# Patient Record
Sex: Male | Born: 1950
Health system: Southern US, Community
[De-identification: ages and names within clinical notes are randomized; demographics above are authoritative.]

## PROBLEM LIST (undated history)

## (undated) DIAGNOSIS — K5090 Crohn's disease, unspecified, without complications: Secondary | ICD-10-CM

## (undated) DIAGNOSIS — M549 Dorsalgia, unspecified: Secondary | ICD-10-CM

## (undated) DIAGNOSIS — I38 Endocarditis, valve unspecified: Secondary | ICD-10-CM

## (undated) DIAGNOSIS — G8929 Other chronic pain: Secondary | ICD-10-CM

## (undated) DIAGNOSIS — N400 Enlarged prostate without lower urinary tract symptoms: Secondary | ICD-10-CM

## (undated) HISTORY — PX: NO PAST SURGERIES: SHX2092

---

## 2015-05-14 ENCOUNTER — Emergency Department: Payer: BLUE CROSS/BLUE SHIELD

## 2015-05-14 ENCOUNTER — Observation Stay
Admission: EM | Admit: 2015-05-14 | Discharge: 2015-05-15 | Disposition: A | Discharge status: Home / Self Care | Payer: BLUE CROSS/BLUE SHIELD | Attending: Internal Medicine | Admitting: Internal Medicine

## 2015-05-14 ENCOUNTER — Observation Stay: Payer: BLUE CROSS/BLUE SHIELD

## 2015-05-14 DIAGNOSIS — R002 Palpitations: Secondary | ICD-10-CM | POA: Insufficient documentation

## 2015-05-14 DIAGNOSIS — Z79899 Other long term (current) drug therapy: Secondary | ICD-10-CM | POA: Insufficient documentation

## 2015-05-14 DIAGNOSIS — G8929 Other chronic pain: Secondary | ICD-10-CM | POA: Diagnosis not present

## 2015-05-14 DIAGNOSIS — Z825 Family history of asthma and other chronic lower respiratory diseases: Secondary | ICD-10-CM | POA: Insufficient documentation

## 2015-05-14 DIAGNOSIS — K5090 Crohn's disease, unspecified, without complications: Secondary | ICD-10-CM | POA: Insufficient documentation

## 2015-05-14 DIAGNOSIS — M542 Cervicalgia: Secondary | ICD-10-CM | POA: Diagnosis not present

## 2015-05-14 DIAGNOSIS — Z8249 Family history of ischemic heart disease and other diseases of the circulatory system: Secondary | ICD-10-CM | POA: Insufficient documentation

## 2015-05-14 DIAGNOSIS — M549 Dorsalgia, unspecified: Secondary | ICD-10-CM | POA: Insufficient documentation

## 2015-05-14 DIAGNOSIS — R0602 Shortness of breath: Principal | ICD-10-CM | POA: Insufficient documentation

## 2015-05-14 DIAGNOSIS — R05 Cough: Secondary | ICD-10-CM | POA: Diagnosis not present

## 2015-05-14 DIAGNOSIS — R079 Chest pain, unspecified: Secondary | ICD-10-CM | POA: Diagnosis present

## 2015-05-14 HISTORY — DX: Crohn's disease, unspecified, without complications: K50.90

## 2015-05-14 HISTORY — DX: Dorsalgia, unspecified: M54.9

## 2015-05-14 HISTORY — DX: Other chronic pain: G89.29

## 2015-05-14 LAB — COMPREHENSIVE METABOLIC PANEL
ALK PHOS: 52 U/L (ref 38–126)
ALT: 22 U/L (ref 17–63)
AST: 23 U/L (ref 15–41)
Albumin: 4.3 g/dL (ref 3.5–5.0)
Anion gap: 7 (ref 5–15)
BILIRUBIN TOTAL: 1 mg/dL (ref 0.3–1.2)
BUN: 10 mg/dL (ref 6–20)
CALCIUM: 10.4 mg/dL — AB (ref 8.9–10.3)
CHLORIDE: 105 mmol/L (ref 101–111)
CO2: 28 mmol/L (ref 22–32)
CREATININE: 1.23 mg/dL (ref 0.61–1.24)
GFR calc Af Amer: >60 mL/min (ref >60)
Glucose, Bld: 102 mg/dL — ABNORMAL HIGH (ref 65–99)
Potassium: 3.7 mmol/L (ref 3.5–5.1)
Sodium: 140 mmol/L (ref 135–145)
Total Protein: 7.2 g/dL (ref 6.5–8.1)

## 2015-05-14 LAB — CBC WITH DIFFERENTIAL/PLATELET
BASOS ABS: 0.1 10*3/uL (ref 0–0.1)
Basophils Relative: 1 %
Eosinophils Absolute: 0.4 10*3/uL (ref 0–0.7)
Eosinophils Relative: 6 %
HEMATOCRIT: 44.1 % (ref 40.0–52.0)
HEMOGLOBIN: 15.5 g/dL (ref 13.0–18.0)
LYMPHS ABS: 1.8 10*3/uL (ref 1.0–3.6)
LYMPHS PCT: 27 %
MCH: 32.1 pg (ref 26.0–34.0)
MCHC: 35.1 g/dL (ref 32.0–36.0)
MCV: 91.6 fL (ref 80.0–100.0)
Monocytes Absolute: 0.6 10*3/uL (ref 0.2–1.0)
Monocytes Relative: 9 %
NEUTROS ABS: 4 10*3/uL (ref 1.4–6.5)
Neutrophils Relative %: 57 %
PLATELETS: 213 10*3/uL (ref 150–440)
RBC: 4.82 MIL/uL (ref 4.40–5.90)
RDW: 13.2 % (ref 11.5–14.5)
WBC: 6.8 10*3/uL (ref 3.8–10.6)

## 2015-05-14 LAB — TROPONIN I: Troponin I: <0.03 ng/mL (ref <0.031)

## 2015-05-14 MED ORDER — ACETAMINOPHEN 325 MG PO TABS: 650.0000 mg | ORAL_TABLET | Freq: Four times a day (QID) | ORAL | Status: DC | PRN

## 2015-05-14 MED ORDER — IOHEXOL 350 MG/ML SOLN
100.0000 mL | Freq: Once | INTRAVENOUS | Status: AC | PRN
  Administered 2015-05-14: 100 mL via INTRAVENOUS

## 2015-05-14 MED ORDER — FLUTICASONE PROPIONATE 50 MCG/ACT NA SUSP
2.0000 | Freq: Every day | NASAL | Status: DC
  Administered 2015-05-14: 2 via NASAL
  Filled 2015-05-14: qty 16

## 2015-05-14 MED ORDER — ADULT MULTIVITAMIN W/MINERALS CH
1.0000 | ORAL_TABLET | Freq: Every day | ORAL | Status: DC
  Administered 2015-05-14: 1 via ORAL
  Filled 2015-05-14: qty 1

## 2015-05-14 MED ORDER — ENOXAPARIN SODIUM 40 MG/0.4ML ~~LOC~~ SOLN
40.0000 mg | SUBCUTANEOUS | Status: DC
  Administered 2015-05-14: 40 mg via SUBCUTANEOUS
  Filled 2015-05-14: qty 0.4

## 2015-05-14 MED ORDER — SODIUM CHLORIDE 0.9 % IJ SOLN
3.0000 mL | Freq: Two times a day (BID) | INTRAMUSCULAR | Status: DC
  Administered 2015-05-14: 3 mL via INTRAVENOUS

## 2015-05-14 MED ORDER — ASPIRIN 81 MG PO CHEW
81.0000 mg | CHEWABLE_TABLET | Freq: Every day | ORAL | Status: DC
  Administered 2015-05-14: 81 mg via ORAL
  Filled 2015-05-14: qty 1

## 2015-05-14 MED ORDER — PSYLLIUM 95 % PO PACK
1.0000 | PACK | Freq: Every day | ORAL | Status: DC
  Administered 2015-05-14: 1 via ORAL
  Filled 2015-05-14 (×2): qty 1

## 2015-05-14 MED ORDER — ACETAMINOPHEN 650 MG RE SUPP: 650.0000 mg | Freq: Four times a day (QID) | RECTAL | Status: DC | PRN

## 2015-05-14 MED ORDER — SIMETHICONE 80 MG PO CHEW
80.0000 mg | CHEWABLE_TABLET | Freq: Two times a day (BID) | ORAL | Status: DC
  Administered 2015-05-14: 160 mg via ORAL
  Filled 2015-05-14: qty 2

## 2015-05-14 NOTE — ED Provider Notes (Signed)
[facility] Emergency Department Provider Note  ____________________________________________  Time seen: Approximately 12:55 PM  I have reviewed the triage vital signs and the nursing notes.   HISTORY  Chief Complaint Shortness of Breath    HPI Douglas Humphrey is a 65 y.o. male with history of Crohn's disease, no other known medical issues (but he he has also not been evaluated by primary care doctor in nearly 15 years) who presents for evaluation of worsening shortness of breath with exertion as well as several days of intermittent left chest pressure radiating into the neck and the left arm, also worse with exertion, no other modifying factors, currently resolved after he received 325 mg aspirin at urgent care. He reports strong family history of coronary artery disease. No personal or family history of PE or DVT. No hemoptysis, no hormone use, no recent surgery, no prolonged period of immobilization. He has had a dry nonproductive cough but no fevers.   No past medical history on file.  There are no active problems to display for this patient.   No past surgical history on file.  Current Outpatient Rx  Name  Route  Sig  Dispense  Refill  . glucosamine-chondroitin 500-400 MG tablet   Oral   Take 1 tablet by mouth at bedtime.         . Guaifenesin 1200 MG TB12   Oral   Take 1,200 mg by mouth at bedtime as needed (for cough).         . Multiple Vitamin (MULTIVITAMIN WITH MINERALS) TABS tablet   Oral   Take 1 tablet by mouth at bedtime.         . polycarbophil (FIBERCON) 625 MG tablet   Oral   Take 625 mg by mouth at bedtime.         . pseudoephedrine (SUDAFED) 30 MG tablet   Oral   Take 30 mg by mouth daily as needed for congestion.         . simethicone (MYLICON) 80 MG chewable tablet   Oral   Chew 80-160 mg by mouth 2 (two) times daily. Pt takes one tablet in the morning and two at night.           Allergies Review of patient's  allergies indicates no known allergies.  No family history on file.  Social History Social History  Substance Use Topics  . Smoking status: Never Smoker   . Smokeless tobacco: Not on file  . Alcohol Use: No    Review of Systems Constitutional: No fever/chills Eyes: No visual changes. ENT: No sore throat. Cardiovascular: + chest pain. Respiratory: +shortness of breath. Gastrointestinal: No abdominal pain.  No nausea, no vomiting.  No diarrhea.  No constipation. Genitourinary: Negative for dysuria. Musculoskeletal: Negative for back pain. Skin: Negative for rash. Neurological: Negative for headaches, focal weakness or numbness.  10-point ROS otherwise negative.  ____________________________________________   PHYSICAL EXAM: Filed Vitals:   05/14/15 1254 05/14/15 1300 05/14/15 1430 05/14/15 1500  BP: 168/84 138/97 144/91 153/92  Pulse: 76 73 71 77  Temp: 98.7 F (37.1 C)     TempSrc: Oral     Resp: Height:  (1.753 m)     Weight: 208 lb (94.348 kg)     SpO2: 96% 97% 95% 94%     Constitutional: Alert and oriented. Well appearing and in no acute distress. Eyes: Conjunctivae are normal. PERRL. EOMI. Head: Atraumatic. Nose: No congestion/rhinnorhea. Mouth/Throat: Mucous membranes are moist.  Oropharynx non-erythematous. Neck: No stridor.   Cardiovascular: Normal rate, regular rhythm. Grossly normal heart sounds.  Good peripheral circulation. Respiratory: Normal respiratory effort.  No retractions. Lungs CTAB. Gastrointestinal: Soft and nontender. No distention.  No CVA tenderness. Genitourinary: deferred Musculoskeletal: No lower extremity tenderness nor edema.  No joint effusions. No calf asymmetry. Neurologic:  Normal speech and language. No gross focal neurologic deficits are appreciated.  Skin:  Skin is warm, dry and intact. No rash noted. Psychiatric: Mood and affect are normal. Speech and behavior are  normal.  ____________________________________________   LABS (all labs ordered are listed, but only abnormal results are displayed)  Labs Reviewed  COMPREHENSIVE METABOLIC PANEL - Abnormal; Notable for the following:    Glucose, Bld 102 (*)    Calcium 10.4 (*)    All other components within normal limits  CBC WITH DIFFERENTIAL/PLATELET  TROPONIN I   ____________________________________________  EKG  ED ECG REPORT I, Gayla Doss, the attending physician, personally viewed and interpreted this ECG.   Date: 05/14/2015  EKG Time: 12:48  Rate: 80  Rhythm: normal sinus rhythm  Axis: normal  Intervals:none  ST&T Change: No acute ST elevation.  Less than 1 mm ST depression V3 and V4. T-wave inversions in lead 3, V3, V4. T-wave flattening in V5, V6.  ____________________________________________  RADIOLOGY  CXR IMPRESSION: 1. Probable COPD and chronic bronchitic changes centrally. 2. No evidence of acute cardiopulmonary abnormality. No evidence of pneumonia. ____________________________________________   PROCEDURES  Procedure(s) performed: None  Critical Care performed: No  ____________________________________________   INITIAL IMPRESSION / ASSESSMENT AND PLAN / ED COURSE  Pertinent labs & imaging results that were available during my care of the patient were reviewed by me and considered in my medical decision making (see chart for details).  Kenley Troop is a 65 y.o. male with history of Crohn's disease, no other known medical issues (but he he has also not been evaluated by primary care doctor in nearly 15 years) who presents for evaluation of worsening shortness of breath with exertion as well as chest pressure with exertion. On exam, he is generally well-appearing and in no acute distress. Vital signs are stable, he is afebrile. EKG shows inverted T waves in inferior and anterior leads with minimal ST depression in anterior leads but no prior EKGs available for  comparison. My concern is for ACS/unstable angina given his exertional complaints, strong family history of coronary artery disease. Additionally, his other risk factors are essentially unknown since he has not seen a doctor in more than a decade. His pain is not ripping or tearing in nature, does not radiate to the back or down towards the feet, I doubt acute aortic dissection. Pain is not pleuritic and I doubt pulmonary embolism. Plan for screening cardiac labs, chest x-ray, anticipate admission.  ----------------------------------------- 3:39 PM on 05/14/2015 ----------------------------------------- Labs reviewed. Troponin is negative. CBC and CMP are general unremarkable. Chest x-ray shows evidence of probable COPD, no pneumonia. Currently, the patient is chest pain-free. Case discussed with hospitalist for admission at this time.  ____________________________________________   FINAL CLINICAL IMPRESSION(S) / ED DIAGNOSES  Final diagnoses:  Chest pain, unspecified chest pain type  SOB (shortness of breath) on exertion      Gayla Doss, MD 05/14/15 1541

## 2015-05-14 NOTE — H&P (Signed)
[facility] - [place] at [facility]   PATIENT NAME: Douglas Humphrey    MR#:  [id number]  DATE OF BIRTH:  1951/03/14  DATE OF ADMISSION:  05/14/2015  PRIMARY CARE PHYSICIAN: Zara Chess at [place] [facility]  REQUESTING/REFERRING PHYSICIAN: Chari Manning  CHIEF COMPLAINT:   Chief Complaint  Patient presents with  . Shortness of Breath    HISTORY OF PRESENT ILLNESS:  Tramayne Sebesta  is a 65 y.o. male presents with shortness of breath and chest pain. This has been going on for the last couple weeks or so. Can happen when he is walking. He describes the chest pain as 3 out of 10 intensity in the left chest radiating down his arms associated with palpitations and shortness of breath. He also has a pain that goes up the back of his neck and it comes and goes. Nothing makes it better or worse. Is also been having some shortness of breath with slight cough.  PAST MEDICAL HISTORY:   Past Medical History  Diagnosis Date  . Crohn's disease (HCC)   . Chronic back pain     PAST SURGICAL HISTORY:   Past Surgical History  Procedure Laterality Date  . No past surgeries      SOCIAL HISTORY:   Social History  Substance Use Topics  . Smoking status: Never Smoker   . Smokeless tobacco: Not on file  . Alcohol Use: No    FAMILY HISTORY:   Family History  Problem Relation Age of Onset  . CAD Mother   . COPD Mother   . CAD Father     DRUG ALLERGIES:  No Known Allergies  REVIEW OF SYSTEMS:  CONSTITUTIONAL: No fever, positive for fatigue.  EYES: Occasional blurry vision. Occasional left thigh pain and itching EARS, NOSE, AND THROAT: No tinnitus or ear pain. No sore throat. Decreased hearing. Positive for runny nose RESPIRATORY: Occasional cough, positive for shortness of breath, no wheezing or hemoptysis.  CARDIOVASCULAR: Positive for chest pain, no orthopnea, edema.  GASTROINTESTINAL: No nausea, vomiting. Positive for diarrhea and abdominal pain shooting in  the abdomen. No blood in bowel movements. GENITOURINARY: No dysuria, hematuria.  ENDOCRINE: No polyuria, nocturia,  HEMATOLOGY: No anemia, easy bruising or bleeding SKIN: No rash or lesion. MUSCULOSKELETAL: Positive for joint pain   NEUROLOGIC: No tingling, numbness, weakness.  PSYCHIATRY: No anxiety or depression.   MEDICATIONS AT HOME:   Prior to Admission medications   Medication Sig Start Date End Date Taking? Authorizing Provider  glucosamine-chondroitin 500-400 MG tablet Take 1 tablet by mouth at bedtime.   Yes Historical Provider, MD  Guaifenesin 1200 MG TB12 Take 1,200 mg by mouth at bedtime as needed (for cough).   Yes Historical Provider, MD  Multiple Vitamin (MULTIVITAMIN WITH MINERALS) TABS tablet Take 1 tablet by mouth at bedtime.   Yes Historical Provider, MD  polycarbophil (FIBERCON) 625 MG tablet Take 625 mg by mouth at bedtime.   Yes Historical Provider, MD  pseudoephedrine (SUDAFED) 30 MG tablet Take 30 mg by mouth daily as needed for congestion.   Yes Historical Provider, MD  simethicone (MYLICON) 80 MG chewable tablet Chew 80-160 mg by mouth 2 (two) times daily. Pt takes one tablet in the morning and two at night.   Yes Historical Provider, MD      VITAL SIGNS:  Blood pressure 153/92, pulse 77, temperature 98.7 F (37.1 C), temperature source Oral, resp. rate 11, height 5\' 9"  (1.753 m), weight 94.348 kg (208 lb), SpO2 94 %.  PHYSICAL EXAMINATION:  GENERAL:  65 y.o.-year-old patient lying in the bed with no acute distress. The patient does have to stop talking at times to take a deep breath. EYES: Pupils equal, round, reactive to light and accommodation. No scleral icterus. Extraocular muscles intact.  HEENT: Head atraumatic, normocephalic. Oropharynx and nasopharynx clear.  NECK:  Supple, no jugular venous distention. No thyroid enlargement, no tenderness.  LUNGS: Normal breath sounds bilaterally, coughs with deep breath. no wheezing, rales,rhonchi or crepitation.  No use of accessory muscles of respiration.  CARDIOVASCULAR: S1, S2 normal. No murmurs, rubs, or gallops.  ABDOMEN: Soft, nontender, nondistended. Bowel sounds present. No organomegaly or mass.  EXTREMITIES: No pedal edema, cyanosis, or clubbing.  NEUROLOGIC: Cranial nerves II through XII are intact. Muscle strength 5/5 in all extremities. Sensation intact. Gait not checked.  PSYCHIATRIC: The patient is alert and oriented x 3.  SKIN: No rash, lesion, or ulcer.   LABORATORY PANEL:   CBC  Recent Labs Lab 05/14/15 1354  WBC 6.8  HGB 15.5  HCT 44.1  PLT 213   ------------------------------------------------------------------------------------------------------------------  Chemistries   Recent Labs Lab 05/14/15 1354  NA 140  K 3.7  CL 105  CO2 28  GLUCOSE 102*  BUN 10  CREATININE 1.23  CALCIUM 10.4*  AST 23  ALT 22  ALKPHOS 52  BILITOT 1.0   ------------------------------------------------------------------------------------------------------------------  Cardiac Enzymes  Recent Labs Lab 05/14/15 1354  TROPONINI <0.03   ------------------------------------------------------------------------------------------------------------------  RADIOLOGY:  Dg Chest 2 View  05/14/2015  CLINICAL DATA:  Dry cough and chest tightness for a couple of weeks. EXAM: CHEST  2 VIEW COMPARISON:  None. FINDINGS: Heart size is normal. Overall cardiomediastinal silhouette is normal in size and configuration. Lungs are at least mildly hyperexpanded suggesting COPD. Suspect associated chronic bronchitic changes centrally. No evidence of pneumonia. No pleural effusion. No pneumothorax seen. Mild degenerative change noted throughout the thoracic spine. No acute - appearing osseous abnormality. IMPRESSION: 1. Probable COPD and chronic bronchitic changes centrally. 2. No evidence of acute cardiopulmonary abnormality. No evidence of pneumonia. Electronically Signed   By: Bary Cobi M.D.   On:  05/14/2015 13:33    EKG:   Sinus rhythm 80 bpm  IMPRESSION AND PLAN:   1. Chest pain and shortness of breath will admitted as an observation. I will get a CT scan of the chest to rule out pulmonary embolism. If that is negative I may be able to get a stress test in the morning. I will get serial cardiac enzymes and monitor. Given aspirin. 2. Crohn's disease - not on any medications 3. Chronic back and neck pain- not on any medications for this. Check a rheumatoid factor. 4. Hypercalcemia- likely with a little dehydration we'll give IV fluid hydration repeat a calcium tomorrow. Send off PTH.  All the records are reviewed and case discussed with ED provider. Management plans discussed with the patient, family and they are in agreement.  CODE STATUS: Full code  TOTAL TIME TAKING CARE OF THIS PATIENT: 50 minutes.    Alford Highland M.D on 05/14/2015 at 4:22 PM  Between 7am to 6pm - Pager - [phone]  After 6pm call admission pager [phone]  [facility]  Office  [phone]  CC: Primary care physician; Zara Chess at [facility] medical

## 2015-05-14 NOTE — Progress Notes (Signed)
Dr. Renae Gloss asked why patient with chest pain with stress test to be completed tomorrow being sent to med-surg floor instead of 2A.  Dr. Renae Gloss feels that patient does not need to be placed on 2A.

## 2015-05-14 NOTE — ED Notes (Signed)
Pt arrives here via [facility] from [facility]   Pt with increased shortness of breath, pressure in his chest and neck pain

## 2015-05-15 ENCOUNTER — Observation Stay: Payer: BLUE CROSS/BLUE SHIELD

## 2015-05-15 ENCOUNTER — Other Ambulatory Visit: Payer: BLUE CROSS/BLUE SHIELD

## 2015-05-15 ENCOUNTER — Observation Stay: Admit: 2015-05-15 | Payer: BLUE CROSS/BLUE SHIELD

## 2015-05-15 ENCOUNTER — Observation Stay
Admit: 2015-05-15 | Discharge: 2015-05-15 | Disposition: A | Discharge status: Home / Self Care | Payer: BLUE CROSS/BLUE SHIELD | Attending: Internal Medicine | Admitting: Internal Medicine

## 2015-05-15 LAB — LIPID PANEL
Cholesterol: 165 mg/dL (ref 0–200)
HDL: 35 mg/dL — ABNORMAL LOW (ref >40)
LDL Cholesterol: 100 mg/dL — ABNORMAL HIGH (ref 0–99)
Total CHOL/HDL Ratio: 4.7 RATIO
Triglycerides: 152 mg/dL — ABNORMAL HIGH (ref <150)
VLDL: 30 mg/dL (ref 0–40)

## 2015-05-15 LAB — CBC
HCT: 43.4 % (ref 40.0–52.0)
Hemoglobin: 14.9 g/dL (ref 13.0–18.0)
MCH: 31.4 pg (ref 26.0–34.0)
MCHC: 34.3 g/dL (ref 32.0–36.0)
MCV: 91.5 fL (ref 80.0–100.0)
PLATELETS: 206 10*3/uL (ref 150–440)
RBC: 4.74 MIL/uL (ref 4.40–5.90)
RDW: 12.9 % (ref 11.5–14.5)
WBC: 7.5 10*3/uL (ref 3.8–10.6)

## 2015-05-15 LAB — BASIC METABOLIC PANEL
Anion gap: 8 (ref 5–15)
BUN: 11 mg/dL (ref 6–20)
CALCIUM: 9.6 mg/dL (ref 8.9–10.3)
CHLORIDE: 105 mmol/L (ref 101–111)
CO2: 27 mmol/L (ref 22–32)
CREATININE: 1.19 mg/dL (ref 0.61–1.24)
GFR calc Af Amer: >60 mL/min (ref >60)
GFR calc non Af Amer: >60 mL/min (ref >60)
Glucose, Bld: 104 mg/dL — ABNORMAL HIGH (ref 65–99)
Potassium: 3.7 mmol/L (ref 3.5–5.1)
Sodium: 140 mmol/L (ref 135–145)

## 2015-05-15 LAB — NM MYOCAR MULTI W/SPECT W/WALL MOTION / EF
CHL CUP NUCLEAR SSS: 2
LVDIAVOL: 67 mL
LVSYSVOL: 15 mL
NUC STRESS TID: 0.9
SDS: 1
SRS: 3

## 2015-05-15 MED ORDER — ASPIRIN 81 MG PO CHEW: 81.0000 mg | CHEWABLE_TABLET | Freq: Every day | ORAL | Status: DC

## 2015-05-15 MED ORDER — REGADENOSON 0.4 MG/5ML IV SOLN
0.4000 mg | Freq: Once | INTRAVENOUS | Status: AC
  Administered 2015-05-15: 0.4 mg via INTRAVENOUS

## 2015-05-15 MED ORDER — TECHNETIUM TC 99M SESTAMIBI - CARDIOLITE
12.2500 | Freq: Once | INTRAVENOUS | Status: AC | PRN
  Administered 2015-05-15: 12.25 via INTRAVENOUS

## 2015-05-15 MED ORDER — TECHNETIUM TC 99M SESTAMIBI - CARDIOLITE
32.1550 | Freq: Once | INTRAVENOUS | Status: AC | PRN
  Administered 2015-05-15: 13:00:00 32.155 via INTRAVENOUS

## 2015-05-15 NOTE — Progress Notes (Signed)
Pt alert and oriented. Called ECHO to notify them that the pt is pending discharge and needs ECHO prior to discharge. ECHO aware. Discharge paper work given to pt.  Nurse report for next shift to be given to oncoming nurse.   IV site still needs to be removed prior to discharge, if not discharged before 7pm

## 2015-05-15 NOTE — Progress Notes (Signed)
SUBJECTIVE: No further chest pains   Filed Vitals:   05/14/15 1839 05/14/15 1958 05/15/15 0045 05/15/15 0516  BP: 158/79 153/86 117/76 108/66  Pulse: 71 80 71 80  Temp: 98.2 F (36.8 C) 98.2 F (36.8 C) 98 F (36.7 C) 97.8 F (36.6 C)  TempSrc: Oral Oral Oral Oral  Resp: 16 18 18 16   Height:      Weight:      SpO2: 97% 96% 97% 94%    Intake/Output Summary (Last 24 hours) at 05/15/15 1301 Last data filed at 05/15/15 0800  Gross per 24 hour  Intake    240 ml  Output   1300 ml  Net  -1060 ml    LABS: Basic Metabolic Panel:  Recent Labs  77/11/65 1354 05/15/15 0527  NA 140 140  K 3.7 3.7  CL 105 105  CO2 28 27  GLUCOSE 102* 104*  BUN 10 11  CREATININE 1.23 1.19  CALCIUM 10.4* 9.6   Liver Function Tests:  Recent Labs  05/14/15 1354  AST 23  ALT 22  ALKPHOS 52  BILITOT 1.0  PROT 7.2  ALBUMIN 4.3   No results for input(s): LIPASE, AMYLASE in the last 72 hours. CBC:  Recent Labs  05/14/15 1354 05/15/15 0527  WBC 6.8 7.5  NEUTROABS 4.0  --   HGB 15.5 14.9  HCT 44.1 43.4  MCV 91.6 91.5  PLT 213 206   Cardiac Enzymes:  Recent Labs  05/14/15 1354 05/14/15 1813 05/14/15 2303  TROPONINI <0.03 <0.03 <0.03   BNP: Invalid input(s): POCBNP D-Dimer: No results for input(s): DDIMER in the last 72 hours. Hemoglobin A1C: No results for input(s): HGBA1C in the last 72 hours. Fasting Lipid Panel:  Recent Labs  05/15/15 0527  CHOL 165  HDL 35*  LDLCALC 100*  TRIG 152*  CHOLHDL 4.7   Thyroid Function Tests: No results for input(s): TSH, T4TOTAL, T3FREE, THYROIDAB in the last 72 hours.  Invalid input(s): FREET3 Anemia Panel: No results for input(s): VITAMINB12, FOLATE, FERRITIN, TIBC, IRON, RETICCTPCT in the last 72 hours.   PHYSICAL EXAM General: Well developed, well nourished, in no acute distress HEENT:  Normocephalic and atramatic Neck:  No JVD.  Lungs: Clear bilaterally to auscultation and percussion. Heart: HRRR . Normal S1 and S2  without gallops or murmurs.  Abdomen: Bowel sounds are positive, abdomen soft and non-tender  Msk:  Back normal, normal gait. Normal strength and tone for age. Extremities: No clubbing, cyanosis or edema.   Neuro: Alert and oriented X 3. Psych:  Good affect, responds appropriately  TELEMETRY: NSR  ASSESSMENT AND PLAN:Chest pains atypical, MI ruled out. Stress test unremarkable and can go home with f/u office tomorrow at 2 pm.  Active Problems:   Shortness of breath    Melika Reder A, MD, [facility] 05/15/2015 1:01 PM

## 2015-05-16 NOTE — Discharge Summary (Signed)
Douglas Humphrey, 65 y.o., DOB Jan 05, 1951, MRN [id number]. Admission date: 05/14/2015 Discharge Date 05/16/2015 Primary MD No primary care provider on file. Admitting Physician Alford Highland, MD  Admission Diagnosis  Shortness of breath [R06.02] SOB (shortness of breath) on exertion [R06.02] Chest pain [R07.9] Chest pain, unspecified chest pain type [R07.9]  Discharge Diagnosis   Active Problems:   Shortness of breath unclear etiology Chest pain noncardiac status post stress test  Crohn's disease Chronic back pain         Hospital Course  Douglas Humphrey is a 65 y.o. male presents with shortness of breath and chest pain. Patient presented with the symptoms placed under observation and was seen by cardiology and underwent stress test which was negative also had a CT of the chest per PE which was negative for pulmonary embolism. Also had echocardiogram of the heart which was negative. At this point patient will need to follow-up with pulmonary and get PFTs test if still short of breath.           Consults  cardiology  Significant Tests:  See full reports for all details      Dg Chest 2 View  05/14/2015  CLINICAL DATA:  Dry cough and chest tightness for a couple of weeks. EXAM: CHEST  2 VIEW COMPARISON:  None. FINDINGS: Heart size is normal. Overall cardiomediastinal silhouette is normal in size and configuration. Lungs are at least mildly hyperexpanded suggesting COPD. Suspect associated chronic bronchitic changes centrally. No evidence of pneumonia. No pleural effusion. No pneumothorax seen. Mild degenerative change noted throughout the thoracic spine. No acute - appearing osseous abnormality. IMPRESSION: 1. Probable COPD and chronic bronchitic changes centrally. 2. No evidence of acute cardiopulmonary abnormality. No evidence of pneumonia. Electronically Signed   By: Bary Barack M.D.   On: 05/14/2015 13:33   Ct Angio Chest Pe W/cm &/or Wo Cm  05/14/2015  CLINICAL DATA:   Shortness of breath and chest pain. EXAM: CT ANGIOGRAPHY CHEST WITH CONTRAST TECHNIQUE: Multidetector CT imaging of the chest was performed using the standard protocol during bolus administration of intravenous contrast. Multiplanar CT image reconstructions and MIPs were obtained to evaluate the vascular anatomy. CONTRAST:  OMNIPAQUE IOHEXOL 350 MG/ML SOLN COMPARISON:  Chest x-ray earlier today. FINDINGS: The pulmonary arteries are well opacified. There is no evidence of pulmonary embolism. The thoracic aorta is normal in caliber. Scattered areas of parenchymal scarring and atelectasis noted without focal airspace consolidation. No edema, pneumothorax, pulmonary nodules or pleural effusions. The heart size is normal. No pericardial fluid identified. Small amount of calcified plaque is identified in the distribution of the LAD. No enlarged lymph nodes are seen. Visualized upper abdominal structures are unremarkable. No bony abnormalities are identified. Review of the MIP images confirms the above findings. IMPRESSION: 1. No evidence of pulmonary embolism. 2. Small amount of calcified plaque in the distribution of the LAD. Electronically Signed   By: Irish Lack M.D.   On: 05/14/2015 17:28   Nm Myocar Multi W/spect W/wall Motion / Ef  05/15/2015   The study is normal.  This is a low risk study.  The left ventricular ejection fraction is hyperdynamic (>65%).  Nuclear stress EF: 78%.        Today   Subjective:   Douglas Humphrey  ills better denies any chest pain or shortness of breath  Objective:   Blood pressure 108/66, pulse 80, temperature 97.8 F (36.6 C), temperature source Oral, resp. rate 16, height  (1.753 m), weight 94.348 kg (  208 lb), SpO2 94 %.  .  Intake/Output Summary (Last 24 hours) at 05/16/15 1640 Last data filed at 05/15/15 1800  Gross per 24 hour  Intake    240 ml  Output    300 ml  Net    -60 ml    Exam VITAL SIGNS: Blood pressure 108/66, pulse 80,  temperature 97.8 F (36.6 C), temperature source Oral, resp. rate 16, height 5\' 9"  (1.753 m), weight 94.348 kg (208 lb), SpO2 94 %.  GENERAL:  65 y.o.-year-old patient lying in the bed with no acute distress.  EYES: Pupils equal, round, reactive to light and accommodation. No scleral icterus. Extraocular muscles intact.  HEENT: Head atraumatic, normocephalic. Oropharynx and nasopharynx clear.  NECK:  Supple, no jugular venous distention. No thyroid enlargement, no tenderness.  LUNGS: Normal breath sounds bilaterally, no wheezing, rales,rhonchi or crepitation. No use of accessory muscles of respiration.  CARDIOVASCULAR: S1, S2 normal. No murmurs, rubs, or gallops.  ABDOMEN: Soft, nontender, nondistended. Bowel sounds present. No organomegaly or mass.  EXTREMITIES: No pedal edema, cyanosis, or clubbing.  NEUROLOGIC: Cranial nerves II through XII are intact. Muscle strength 5/5 in all extremities. Sensation intact. Gait not checked.  PSYCHIATRIC: The patient is alert and oriented x 3.  SKIN: No obvious rash, lesion, or ulcer.   Data Review     CBC w Diff: Lab Results  Component Value Date   WBC 7.5 05/15/2015   HGB 14.9 05/15/2015   HCT 43.4 05/15/2015   PLT 206 05/15/2015   LYMPHOPCT 27 05/14/2015   MONOPCT 9 05/14/2015   EOSPCT 6 05/14/2015   BASOPCT 1 05/14/2015   CMP: Lab Results  Component Value Date   NA 140 05/15/2015   K 3.7 05/15/2015   CL 105 05/15/2015   CO2 27 05/15/2015   BUN 11 05/15/2015   CREATININE 1.19 05/15/2015   PROT 7.2 05/14/2015   ALBUMIN 4.3 05/14/2015   BILITOT 1.0 05/14/2015   ALKPHOS 52 05/14/2015   AST 23 05/14/2015   ALT 22 05/14/2015  .  Micro Results No results found for this or any previous visit (from the past 240 hour(s)).   Code Status History    Date Active Date Inactive Code Status Order ID Comments User Context   05/14/2015  4:17 PM 05/15/2015 11:45 PM Full Code [id number]  Alford Highland, MD ED          Follow-up  Information    Follow up with Erin Fulling, MD. Schedule an appointment as soon as possible for a visit in 2 weeks.   Specialty:  Pulmonary Disease   Why:  shortness of breath   Contact information:   [address] [place] [address] [phone]       Follow up with pcp In 7 days.      Discharge Medications     Medication List    TAKE these medications        aspirin 81 MG chewable tablet  Chew 1 tablet (81 mg total) by mouth daily.     glucosamine-chondroitin 500-400 MG tablet  Take 1 tablet by mouth at bedtime.     Guaifenesin 1200 MG Tb12  Take 1,200 mg by mouth at bedtime as needed (for cough).     multivitamin with minerals Tabs tablet  Take 1 tablet by mouth at bedtime.     polycarbophil 625 MG tablet  Commonly known as:  FIBERCON  Take 625 mg by mouth at bedtime.     pseudoephedrine  30 MG tablet  Commonly known as:  SUDAFED  Take 30 mg by mouth daily as needed for congestion.     simethicone 80 MG chewable tablet  Commonly known as:  MYLICON  Chew 80-160 mg by mouth 2 (two) times daily. Pt takes one tablet in the morning and two at night.           Total Time in preparing paper work, data evaluation and todays exam - 35 minutes  Auburn Bilberry M.D on 05/16/2015 at 4:40 PM  [facility]   Office  [phone]

## 2015-07-28 ENCOUNTER — Ambulatory Visit
Admission: RE | Admit: 2015-07-28 | Payer: BLUE CROSS/BLUE SHIELD | Source: Ambulatory Visit | Admitting: Gastroenterology

## 2015-07-28 ENCOUNTER — Encounter: Admission: RE | Payer: Self-pay | Source: Ambulatory Visit

## 2015-07-28 SURGERY — COLONOSCOPY WITH PROPOFOL
Anesthesia: General

## 2016-02-19 ENCOUNTER — Encounter: Payer: Self-pay | Admitting: Internal Medicine

## 2017-03-24 DIAGNOSIS — I2510 Atherosclerotic heart disease of native coronary artery without angina pectoris: Secondary | ICD-10-CM | POA: Diagnosis not present

## 2017-03-24 DIAGNOSIS — I340 Nonrheumatic mitral (valve) insufficiency: Secondary | ICD-10-CM | POA: Diagnosis not present

## 2017-03-24 DIAGNOSIS — K219 Gastro-esophageal reflux disease without esophagitis: Secondary | ICD-10-CM | POA: Diagnosis not present

## 2017-03-24 DIAGNOSIS — I351 Nonrheumatic aortic (valve) insufficiency: Secondary | ICD-10-CM | POA: Diagnosis not present

## 2017-03-28 DIAGNOSIS — I2510 Atherosclerotic heart disease of native coronary artery without angina pectoris: Secondary | ICD-10-CM | POA: Diagnosis not present

## 2017-04-01 DIAGNOSIS — M4126 Other idiopathic scoliosis, lumbar region: Secondary | ICD-10-CM | POA: Diagnosis not present

## 2017-04-01 DIAGNOSIS — K5090 Crohn's disease, unspecified, without complications: Secondary | ICD-10-CM | POA: Diagnosis not present

## 2017-04-01 DIAGNOSIS — M1990 Unspecified osteoarthritis, unspecified site: Secondary | ICD-10-CM | POA: Diagnosis not present

## 2017-04-01 DIAGNOSIS — M545 Low back pain: Secondary | ICD-10-CM | POA: Diagnosis not present

## 2017-08-22 DIAGNOSIS — R002 Palpitations: Secondary | ICD-10-CM | POA: Diagnosis not present

## 2017-08-22 DIAGNOSIS — I340 Nonrheumatic mitral (valve) insufficiency: Secondary | ICD-10-CM | POA: Diagnosis not present

## 2017-08-22 DIAGNOSIS — K219 Gastro-esophageal reflux disease without esophagitis: Secondary | ICD-10-CM | POA: Diagnosis not present

## 2017-08-22 DIAGNOSIS — I351 Nonrheumatic aortic (valve) insufficiency: Secondary | ICD-10-CM | POA: Diagnosis not present

## 2019-12-12 ENCOUNTER — Other Ambulatory Visit: Payer: Self-pay

## 2019-12-12 ENCOUNTER — Emergency Department: Payer: Medicare Other

## 2019-12-12 ENCOUNTER — Emergency Department
Admission: EM | Admit: 2019-12-12 | Discharge: 2019-12-12 | Disposition: A | Discharge status: Home / Self Care | Payer: Medicare Other | Attending: Emergency Medicine | Admitting: Emergency Medicine

## 2019-12-12 ENCOUNTER — Encounter: Payer: Self-pay | Admitting: Emergency Medicine

## 2019-12-12 DIAGNOSIS — Z5321 Procedure and treatment not carried out due to patient leaving prior to being seen by health care provider: Secondary | ICD-10-CM | POA: Insufficient documentation

## 2019-12-12 DIAGNOSIS — R509 Fever, unspecified: Secondary | ICD-10-CM | POA: Diagnosis not present

## 2019-12-12 DIAGNOSIS — R05 Cough: Secondary | ICD-10-CM | POA: Diagnosis not present

## 2019-12-12 HISTORY — DX: Endocarditis, valve unspecified: I38

## 2019-12-12 LAB — COMPREHENSIVE METABOLIC PANEL
ALT: 22 U/L (ref 0–44)
AST: 27 U/L (ref 15–41)
Albumin: 4.6 g/dL (ref 3.5–5.0)
Alkaline Phosphatase: 45 U/L (ref 38–126)
Anion gap: 14 (ref 5–15)
BUN: 13 mg/dL (ref 8–23)
CO2: 21 mmol/L — ABNORMAL LOW (ref 22–32)
Calcium: 9.6 mg/dL (ref 8.9–10.3)
Chloride: 103 mmol/L (ref 98–111)
Creatinine, Ser: 1.13 mg/dL (ref 0.61–1.24)
GFR calc Af Amer: >60 mL/min (ref >60)
GFR calc non Af Amer: >60 mL/min (ref >60)
Glucose, Bld: 114 mg/dL — ABNORMAL HIGH (ref 70–99)
Potassium: 4.1 mmol/L (ref 3.5–5.1)
Sodium: 138 mmol/L (ref 135–145)
Total Bilirubin: 0.9 mg/dL (ref 0.3–1.2)
Total Protein: 8 g/dL (ref 6.5–8.1)

## 2019-12-12 LAB — CBC WITH DIFFERENTIAL/PLATELET
Abs Immature Granulocytes: 0.02 10*3/uL (ref 0.00–0.07)
Basophils Absolute: 0 10*3/uL (ref 0.0–0.1)
Basophils Relative: 0 %
Eosinophils Absolute: 0 10*3/uL (ref 0.0–0.5)
Eosinophils Relative: 1 %
HCT: 42 % (ref 39.0–52.0)
Hemoglobin: 15.3 g/dL (ref 13.0–17.0)
Immature Granulocytes: 0 %
Lymphocytes Relative: 22 %
Lymphs Abs: 1.4 10*3/uL (ref 0.7–4.0)
MCH: 32.6 pg (ref 26.0–34.0)
MCHC: 36.4 g/dL — ABNORMAL HIGH (ref 30.0–36.0)
MCV: 89.4 fL (ref 80.0–100.0)
Monocytes Absolute: 0.3 10*3/uL (ref 0.1–1.0)
Monocytes Relative: 5 %
Neutro Abs: 4.5 10*3/uL (ref 1.7–7.7)
Neutrophils Relative %: 72 %
Platelets: 144 10*3/uL — ABNORMAL LOW (ref 150–400)
RBC: 4.7 MIL/uL (ref 4.22–5.81)
RDW: 12.2 % (ref 11.5–15.5)
WBC: 6.2 10*3/uL (ref 4.0–10.5)
nRBC: 0 % (ref 0.0–0.2)

## 2019-12-12 NOTE — ED Triage Notes (Signed)
Pt presents to ED with fever today and a productive barking cough for the past week+. Has been taking mucinex with some relief initially but now is feeling worse. Slight increased work of breathing noted with ambulation. Cough seems to worsen when talking.

## 2020-01-28 ENCOUNTER — Other Ambulatory Visit: Payer: Self-pay | Admitting: Nurse Practitioner

## 2020-01-28 DIAGNOSIS — N492 Inflammatory disorders of scrotum: Secondary | ICD-10-CM

## 2020-01-30 ENCOUNTER — Ambulatory Visit
Admission: RE | Admit: 2020-01-30 | Discharge: 2020-01-30 | Disposition: A | Discharge status: Home / Self Care | Payer: Medicare Other | Source: Ambulatory Visit | Attending: Nurse Practitioner | Admitting: Nurse Practitioner

## 2020-01-30 DIAGNOSIS — N492 Inflammatory disorders of scrotum: Secondary | ICD-10-CM | POA: Diagnosis not present

## 2020-02-20 ENCOUNTER — Encounter: Payer: Self-pay | Admitting: Urology

## 2020-02-20 ENCOUNTER — Other Ambulatory Visit: Payer: Self-pay

## 2020-02-20 ENCOUNTER — Ambulatory Visit: Payer: Medicare Other | Admitting: Urology

## 2020-02-20 VITALS — BP 147/78 | HR 80 | Ht 69.0 in | Wt 199.8 lb

## 2020-02-20 DIAGNOSIS — N453 Epididymo-orchitis: Secondary | ICD-10-CM

## 2020-02-20 DIAGNOSIS — N442 Benign cyst of testis: Secondary | ICD-10-CM | POA: Diagnosis not present

## 2020-02-20 DIAGNOSIS — N452 Orchitis: Secondary | ICD-10-CM | POA: Diagnosis not present

## 2020-02-20 NOTE — Patient Instructions (Signed)
Orchitis  Orchitis is inflammation of a testicle. Testicles are the male organs that produce sperm. The testicles are held in a fleshy sac (scrotum) located behind the penis. Orchitis usually affects only one testicle, but it can affect both. Orchitis is caused by infection. Many kinds of bacteria and viruses can cause this infection. The condition can develop suddenly. What are the causes? This condition may be caused by:  Infection from viruses or bacteria.  Other organisms, such as fungi or parasites (rare). This is common in men who have a weak body defense system (immune system), such as men with HIV. Bacteria   Bacterial orchitis often occurs along with an infection of the tube that collects and stores sperm (epididymis).  In men who are not sexually active, this infection usually starts as a urinary tract infection and spreads to the testicle.  In sexually active men, sexually transmitted infections (STIs) are the most common cause of bacterial orchitis. These can include: ? Gonorrhea. ? Chlamydia. Viruses  Mumps is the most common cause of viral orchitis, though mumps is now rare in many areas because of vaccination.  Other viruses that can cause orchitis include: ? The chickenpox virus (varicella-zoster virus). ? The virus that causes mononucleosis (Epstein-Barr virus). What increases the risk? The following factors may make you more likely to develop this condition:  For viral orchitis: ? Not having been vaccinated against mumps.  For bacterial orchitis: ? Having had frequent urinary tract infections. ? Engaging in high-risk sexual behaviors, such as having multiple sexual partners or having sex without using a condom. ? Having a sexual partner with an STI. ? Having had urinary tract surgery. ? Using a tube that is passed through the penis to drain urine (Foley catheter). ? Having an enlarged prostate gland. What are the signs or symptoms? The most common symptoms of  orchitis are swelling and pain in the scrotum. Other signs and symptoms may include:  Feeling generally sick (malaise).  Fever and chills.  Painful urination.  Painful ejaculation.  Headache.  Fatigue.  Nausea.  Blood or discharge from the penis.  Swollen lymph nodes in the groin area (inguinal nodes). How is this diagnosed? This condition may be diagnosed based on:  Your symptoms. Your health care provider may suspect orchitis if you have a painful, swollen testicle along with other signs and symptoms of the condition.  A physical exam. You may also have other tests, including:  A blood test to check for signs of infection.  A urine test to check for a urinary tract infection or STI.  Using a swab to collect a fluid sample from the tip of the penis to test for STIs.  Taking an image of the testicle using sound waves and a computer (testicular ultrasound). How is this treated? Treatment for this condition depends on the cause.  For bacterial orchitis, your health care provider may prescribe antibiotic medicines. Bacterial infections usually clear up within a few days. For both viral infections and bacterial infections, you may be treated with:  Rest.  Anti-inflammatory medicines.  Pain medicines.  Raising (elevating) the scrotum with a towel or pillow underneath and applying ice. Follow these instructions at home:  Rest as directed by your health care provider.  Take over-the-counter and prescription medicines only as told by your health care provider.  If you were prescribed an antibiotic medicine, take it as told by your health care provider. Do not stop taking the antibiotic even if you start to feel better.    Do not have sex until your health care provider says it is okay to do so.  Elevate your scrotum and apply ice as directed: ? Put ice in a plastic bag. ? Place a small towel or pillow between your legs. ? Rest your scrotum on the pillow or  towel. ? Place another towel between your skin and the plastic bag. ? Leave the ice on for 20 minutes, 2-3 times a day.  Keep all follow-up visits as told by your health care provider. This is important. Contact a health care provider if:  You have a fever.  Pain and swelling have not gotten better after 3 days. Get help right away if:  Your pain is getting worse.  The swelling in your testicle gets worse. Summary  Orchitis is inflammation of a testicle. It is caused by an infection from bacteria or a virus.  The most common symptoms of orchitis are swelling and pain in the scrotum.  Treatment for this condition depends on the cause. It may include medicines to fight the infection, reduce inflammation, and relieve the pain.  Follow your health care provider's instructions about resting, icing, not having sex, and taking medicines. This information is not intended to replace advice given to you by your health care provider. Make sure you discuss any questions you have with your health care provider. Document Revised: 04/29/2017 Document Reviewed: 04/29/2017 Elsevier Patient Education  2020 [employer]  

## 2020-02-20 NOTE — Progress Notes (Signed)
   02/20/20 2:25 PM   Douglas Humphrey 1950/05/16 [id number]  CC: Orchitis, testicular lesion  HPI: I saw Douglas Humphrey in urology clinic for the above issues today.  He is a 69 year old male who reportedly had a positive urinalysis with left testicular swelling and pain, as well as a scrotal ultrasound that showed increased blood flow to the left testicle as well as a small intra-testicular nodule that was hypoechoic.  He denies any prior history of UTI, gross hematuria, or urinary symptoms.  He notes that he was helping someone off a sidewalk around the time that his left-sided testicular pain started.  He was treated with a course of doxycycline which resolved his left scrotal pain and swelling.  He denies any significant complaints today.  There is no previous scrotal ultrasound to reviewed.  A follow-up urinalysis with his PCP was reportedly completely benign after 2-week course of antibiotics.   PMH: Past Medical History:  Diagnosis Date  . Chronic back pain   . Crohn's disease (HCC)   . Leaky heart valve    Family History: Family History  Problem Relation Age of Onset  . CAD Mother   . COPD Mother   . CAD Father     Social History:  reports that he has never smoked. He has never used smokeless tobacco. He reports that he does not drink alcohol and does not use drugs.  Physical Exam: BP (!) 147/78 (BP Location: Left Arm, Patient Position: Sitting, Cuff Size: Large)   Pulse 80   Ht 5\' 9"  (1.753 m)   Wt 199 lb 12.8 oz (90.6 kg)   BMI 29.51 kg/m    Constitutional:  Alert and oriented, No acute distress. Cardiovascular: No clubbing, cyanosis, or edema. Respiratory: Normal respiratory effort, no increased work of breathing. GI: Abdomen is soft, nontender, nondistended, no abdominal masses GU: Phallus with patent meatus, right testicle 20 cc and descended without masses or tenderness, left testicle slightly enlarged and mildly tender, no distinct mass  Laboratory Data: Reviewed,  see HPI  Pertinent Imaging: I have personally reviewed the scrotal ultrasound dated 01/30/2020  Assessment & Plan:   In summary, is a 69 year old male with a recent episode of left-sided orchitis with swelling, pain, and positive urinalysis treated with doxycycline with resolution of symptoms.  Scrotal ultrasound at the time of infection showed marked left-sided hyperemia consistent with orchitis as well as an indeterminate 1 cm nodule at the inferior pole of the left testicle.  We discussed possible etiologies including infectious/inflammatory/malignancy/benign congenital cyst, and the need for close follow-up.  We also discussed snug fitting underwear and NSAIDs as needed for his resolving orchitis.  RTC 4 to 6 weeks with repeat scrotal ultrasound, IPSS, PVR If lesion still present, consider tumor markers and abdominal cross-sectional imaging  74, MD 02/20/2020  [facility] [address] [place], [place] [phone]

## 2020-03-02 ENCOUNTER — Emergency Department: Payer: Medicare Other

## 2020-03-02 ENCOUNTER — Inpatient Hospital Stay
Admission: EM | Admit: 2020-03-02 | Discharge: 2020-03-06 | DRG: 690 | Disposition: A | Discharge status: Home / Self Care | Payer: Medicare Other | Attending: Internal Medicine | Admitting: Internal Medicine

## 2020-03-02 DIAGNOSIS — N433 Hydrocele, unspecified: Secondary | ICD-10-CM | POA: Diagnosis present

## 2020-03-02 DIAGNOSIS — N12 Tubulo-interstitial nephritis, not specified as acute or chronic: Principal | ICD-10-CM

## 2020-03-02 DIAGNOSIS — Z6829 Body mass index (BMI) 29.0-29.9, adult: Secondary | ICD-10-CM

## 2020-03-02 DIAGNOSIS — L0390 Cellulitis, unspecified: Secondary | ICD-10-CM | POA: Diagnosis present

## 2020-03-02 DIAGNOSIS — E785 Hyperlipidemia, unspecified: Secondary | ICD-10-CM | POA: Diagnosis present

## 2020-03-02 DIAGNOSIS — Z87440 Personal history of urinary (tract) infections: Secondary | ICD-10-CM

## 2020-03-02 DIAGNOSIS — L03818 Cellulitis of other sites: Secondary | ICD-10-CM | POA: Diagnosis not present

## 2020-03-02 DIAGNOSIS — I10 Essential (primary) hypertension: Secondary | ICD-10-CM

## 2020-03-02 DIAGNOSIS — N400 Enlarged prostate without lower urinary tract symptoms: Secondary | ICD-10-CM | POA: Diagnosis present

## 2020-03-02 DIAGNOSIS — R1909 Other intra-abdominal and pelvic swelling, mass and lump: Secondary | ICD-10-CM | POA: Diagnosis present

## 2020-03-02 DIAGNOSIS — N50811 Right testicular pain: Secondary | ICD-10-CM

## 2020-03-02 DIAGNOSIS — Z825 Family history of asthma and other chronic lower respiratory diseases: Secondary | ICD-10-CM

## 2020-03-02 DIAGNOSIS — N50812 Left testicular pain: Secondary | ICD-10-CM

## 2020-03-02 DIAGNOSIS — K5090 Crohn's disease, unspecified, without complications: Secondary | ICD-10-CM | POA: Diagnosis present

## 2020-03-02 DIAGNOSIS — I5032 Chronic diastolic (congestive) heart failure: Secondary | ICD-10-CM

## 2020-03-02 DIAGNOSIS — G8929 Other chronic pain: Secondary | ICD-10-CM | POA: Diagnosis present

## 2020-03-02 DIAGNOSIS — I110 Hypertensive heart disease with heart failure: Secondary | ICD-10-CM | POA: Diagnosis present

## 2020-03-02 DIAGNOSIS — R630 Anorexia: Secondary | ICD-10-CM | POA: Diagnosis present

## 2020-03-02 DIAGNOSIS — I700 Atherosclerosis of aorta: Secondary | ICD-10-CM | POA: Diagnosis present

## 2020-03-02 DIAGNOSIS — Z7982 Long term (current) use of aspirin: Secondary | ICD-10-CM

## 2020-03-02 DIAGNOSIS — Z79899 Other long term (current) drug therapy: Secondary | ICD-10-CM

## 2020-03-02 DIAGNOSIS — N452 Orchitis: Secondary | ICD-10-CM

## 2020-03-02 DIAGNOSIS — N453 Epididymo-orchitis: Secondary | ICD-10-CM

## 2020-03-02 DIAGNOSIS — Z20822 Contact with and (suspected) exposure to covid-19: Secondary | ICD-10-CM | POA: Diagnosis present

## 2020-03-02 DIAGNOSIS — B9620 Unspecified Escherichia coli [E. coli] as the cause of diseases classified elsewhere: Secondary | ICD-10-CM | POA: Diagnosis present

## 2020-03-02 DIAGNOSIS — N5089 Other specified disorders of the male genital organs: Secondary | ICD-10-CM

## 2020-03-02 DIAGNOSIS — Z87438 Personal history of other diseases of male genital organs: Secondary | ICD-10-CM

## 2020-03-02 LAB — COMPREHENSIVE METABOLIC PANEL
ALT: 20 U/L (ref 0–44)
AST: 19 U/L (ref 15–41)
Albumin: 3.8 g/dL (ref 3.5–5.0)
Alkaline Phosphatase: 83 U/L (ref 38–126)
Anion gap: 14 (ref 5–15)
BUN: 16 mg/dL (ref 8–23)
CO2: 21 mmol/L — ABNORMAL LOW (ref 22–32)
Calcium: 9.5 mg/dL (ref 8.9–10.3)
Chloride: 100 mmol/L (ref 98–111)
Creatinine, Ser: 1.11 mg/dL (ref 0.61–1.24)
GFR, Estimated: >60 mL/min (ref >60)
Glucose, Bld: 199 mg/dL — ABNORMAL HIGH (ref 70–99)
Potassium: 4 mmol/L (ref 3.5–5.1)
Sodium: 135 mmol/L (ref 135–145)
Total Bilirubin: 1.3 mg/dL — ABNORMAL HIGH (ref 0.3–1.2)
Total Protein: 7.8 g/dL (ref 6.5–8.1)

## 2020-03-02 LAB — CBC
HCT: 39.2 % (ref 39.0–52.0)
Hemoglobin: 13.8 g/dL (ref 13.0–17.0)
MCH: 31.8 pg (ref 26.0–34.0)
MCHC: 35.2 g/dL (ref 30.0–36.0)
MCV: 90.3 fL (ref 80.0–100.0)
Platelets: 281 10*3/uL (ref 150–400)
RBC: 4.34 MIL/uL (ref 4.22–5.81)
RDW: 13.3 % (ref 11.5–15.5)
WBC: 21.2 10*3/uL — ABNORMAL HIGH (ref 4.0–10.5)
nRBC: 0 % (ref 0.0–0.2)

## 2020-03-02 LAB — URINALYSIS, COMPLETE (UACMP) WITH MICROSCOPIC
Bilirubin Urine: NEGATIVE
Glucose, UA: NEGATIVE mg/dL
Hgb urine dipstick: NEGATIVE
Ketones, ur: 5 mg/dL — AB
Nitrite: NEGATIVE
Protein, ur: 100 mg/dL — AB
Specific Gravity, Urine: 1.017 (ref 1.005–1.030)
Squamous Epithelial / HPF: NONE SEEN (ref 0–5)
WBC, UA: >50 WBC/hpf — ABNORMAL HIGH (ref 0–5)
pH: 5 (ref 5.0–8.0)

## 2020-03-02 LAB — RESPIRATORY PANEL BY RT PCR (FLU A&B, COVID)
Influenza A by PCR: NEGATIVE
Influenza B by PCR: NEGATIVE
SARS Coronavirus 2 by RT PCR: NEGATIVE

## 2020-03-02 LAB — LACTIC ACID, PLASMA
Lactic Acid, Venous: 1.5 mmol/L (ref 0.5–1.9)
Lactic Acid, Venous: 1.2 mmol/L (ref 0.5–1.9)

## 2020-03-02 MED ORDER — SODIUM CHLORIDE 0.9 % IV SOLN
1000.0000 mL | Freq: Once | INTRAVENOUS | Status: AC
  Administered 2020-03-02: 1000 mL via INTRAVENOUS

## 2020-03-02 MED ORDER — ONDANSETRON HCL 4 MG/2ML IJ SOLN
4.0000 mg | Freq: Once | INTRAMUSCULAR | Status: AC
  Administered 2020-03-02: 4 mg via INTRAVENOUS
  Filled 2020-03-02: qty 2

## 2020-03-02 MED ORDER — SODIUM CHLORIDE 0.9 % IV SOLN
1.0000 g | Freq: Once | INTRAVENOUS | Status: AC
  Administered 2020-03-02: 1 g via INTRAVENOUS
  Filled 2020-03-02: qty 10

## 2020-03-02 MED ORDER — MORPHINE SULFATE (PF) 4 MG/ML IV SOLN
4.0000 mg | Freq: Once | INTRAVENOUS | Status: AC
  Administered 2020-03-02: 4 mg via INTRAVENOUS
  Filled 2020-03-02: qty 1

## 2020-03-02 NOTE — Progress Notes (Signed)
CODE SEPSIS - PHARMACY COMMUNICATION  **Broad Spectrum Antibiotics should be administered within 1 hour of Sepsis diagnosis**  Time Code Sepsis Called/Page Received: 1633  Antibiotics Ordered: Ceftriaxone 1g IVx1  Time of 1st antibiotic administration: 1642   Raiford Noble, PharmD Pharmacy Resident  03/02/2020 4:37 PM

## 2020-03-02 NOTE — H&P (Signed)
History and Physical   Douglas Humphrey KGM:[id number] DOB: 20-Jan-1951 DOA: 03/02/2020  PCP: [name], MD  Outpatient Specialists: none Patient coming from: home  I have personally briefly reviewed patient's old medical records in [facility] EMR.  Chief Concern: right scrotal pain with radiation to abdomen and right back  HPI: Douglas Humphrey is a 69 y.o. male with medical history significant for hypertension, grade 1 diastolic dysfunction, (echo in 2017), history of left orchitis, presents to the emergency department for chief concerns of RIGHT scrotal pain.  He reports right testicular pain started Thursday, 02/28/2020, and then radiated to the right lower quadrant abdominal pain that started Thursday, 02/28/20 and progressively gotten worse.  He further endorses right back pain.   He endorses associated lost of appetite and weakness and right back pain, and urinary incontinence, dysuria, positive cough that started Thursday with light yellow phlegm (this is not normal for him), he endorses shortness of breath worse with inspiration.  Denies fever/chills/diarrhea/constipaton/blood in stool/nausea/vomitting/weight loss/headache/visionchanges/dysphagia/odynophagia.   Social: lives alone. Retired, used to work in Engineer, drilling for [employer]. Denies tobacco, etoh, recreational drug use. No sexually active. Last time he was sexually active was about 10 years ago. He previously had sex with men and woman.   ED Course: Discussed with ED provider. Admit for pylonephritis.   Review of Systems: As per HPI otherwise 10 point review of systems negative.  Assessment/Plan  Active Problems:   Pyelonephritis   H/O orchitis   Cellulitis   Diastolic CHF, chronic (HCC)   Essential hypertension   Right scrotal pain with right back pain and positive CVA on exam-present on admission, concerns for pyelonephritis however CT stone chasers was negative, other differentials include  scrotal cellulitis versus orchitis -Status post ceftriaxone 1 g in the ED, 1 L normal saline bolus and morphine 4 mg for pain -Ceftriaxone IV 1 g daily continued -Acetaminophen as needed for moderate pain, fever -Morphine 2 g IV every 4 hours for severe pain -Zofran as needed for nausea and vomiting  -UA was positive for leukocytes -Blood cultures pending -Lactic acid was negative x2 -Past history of risky sexual behavior and recurrent orchitis, checking for HIV, chlamydia gonorrhea, RPR-discussed with patient and he is in agreement -Telemetry -If patient does not improve would recommend a.m. provider to consult urology for inpatient evaluation  Grade 1 diastolic dysfunction-echo 2017, appears euvolemic at this time, resumed spironolactone 25 mg daily and metoprolol succinate 25 mg daily  Hypertension-metoprolol resumed  Lipidemia-rosuvastatin 10 mg daily resumed  DVT prophylaxis: Enoxaparin Code Status: Full code Diet: Heart healthy Family Communication: No Disposition Plan: Pending clinical course Consults called: None at this time Admission status: Observation with telemetry  Past Medical History:  Diagnosis Date  . Chronic back pain   . Crohn's disease (HCC)   . Leaky heart valve    Past Surgical History:  Procedure Laterality Date  . NO PAST SURGERIES     Social History:  reports that he has never smoked. He has never used smokeless tobacco. He reports that he does not drink alcohol and does not use drugs.  No Known Allergies Family History  Problem Relation Age of Onset  . CAD Mother   . COPD Mother   . CAD Father    Family history: Family history reviewed and COPD in mother. No testicular cancer in family.  Prior to Admission medications   Medication Sig Start Date End Date Taking? Authorizing Provider  glucosamine-chondroitin 500-400 MG tablet Take 1  tablet by mouth at bedtime.   Yes [provider]  Guaifenesin 1200 MG TB12 Take 1,200 mg by mouth  at bedtime as needed (for cough).   Yes [provider]  metoprolol succinate (TOPROL-XL) 25 MG 24 hr tablet Take 25 mg by mouth daily. 12/22/19  Yes [provider]  Multiple Vitamin (MULTIVITAMIN WITH MINERALS) TABS tablet Take 1 tablet by mouth at bedtime.   Yes [provider]  polycarbophil (FIBERCON) 625 MG tablet Take 625 mg by mouth at bedtime.   Yes [provider]  rosuvastatin (CRESTOR) 10 MG tablet Take 10 mg by mouth daily. 12/31/19  Yes [provider]  spironolactone (ALDACTONE) 25 MG tablet Take 25 mg by mouth daily. 01/14/20  Yes [provider]   Physical Exam: Vitals:   03/02/20 2100 03/02/20 2200 03/02/20 2300 03/03/20 0000  BP: 115/64 122/74 113/75 120/80  Pulse:  100 100 94  Resp: (!) 22 (!) 22 18 17   Temp:  99 F (37.2 C)    TempSrc:  Oral    SpO2:  98% 98% 91%  Weight:      Height:       Constitutional: appears appropriate age, NAD, calm, comfortable Eyes: PERRL, lids and conjunctivae normal ENMT: Mucous membranes are moist. Posterior pharynx clear of any exudate or lesions. Age-appropriate dentition. Hearing appropriate Neck: normal, supple, no masses, no thyromegaly Respiratory: clear to auscultation bilaterally, no wheezing, no crackles. Normal respiratory effort. No accessory muscle use.  Cardiovascular: Regular rate and rhythm, no murmurs / rubs / gallops. No extremity edema. 2+ pedal pulses. No carotid bruits.  Abdomen: no tenderness, no masses palpated, no hepatosplenomegaly. Bowel sounds positive.  Musculoskeletal: no clubbing / cyanosis. No joint deformity upper and lower extremities. Good ROM, no contractures, no atrophy. Normal muscle tone.  GU: Right scrotal swelling with edema, erythema, warmth Skin: no rashes, lesions, ulcers. No induration Neurologic: CN 2-12 grossly intact. Sensation intact. Strength 5/5 in all 4.  Psychiatric: Normal judgment and insight. Alert and oriented x 3. Normal mood.    EKG: None ordered  Chest x-ray on Admission: Personally reviewed and I agree with radiologist reading as below.  DG Chest Port 1 View  Result Date: 03/02/2020 CLINICAL DATA:  69 year old male with history of cough and shortness of breath. EXAM: PORTABLE CHEST 1 VIEW COMPARISON:  Chest x-ray 12/12/2019. FINDINGS: Lung volumes are normal. No consolidative airspace disease. No pleural effusions. No pneumothorax. No pulmonary nodule or mass noted. Pulmonary vasculature and the cardiomediastinal silhouette are within normal limits. IMPRESSION: No radiographic evidence of acute cardiopulmonary disease. Electronically Signed   By: 12/14/2019 M.D.   On: 03/02/2020 18:07   CT Renal Stone Study  Result Date: 03/02/2020 CLINICAL DATA:  UTI, LEFT side lower back pain radiating around to side and to groin, BILATERAL testicular swelling, history Crohn's disease EXAM: CT ABDOMEN AND PELVIS WITHOUT CONTRAST TECHNIQUE: Multidetector CT imaging of the abdomen and pelvis was performed following the standard protocol without IV contrast. Sagittal and coronal MPR images reconstructed from axial data set. No oral contrast administered COMPARISON:  None FINDINGS: Lower chest: Minimal atelectasis or scarring at lung bases Hepatobiliary: Gallbladder and liver normal appearance Pancreas: Normal appearance Spleen: Normal appearance Adrenals/Urinary Tract: Adrenal glands normal appearance. Kidneys, ureters, and bladder normal appearance. No urinary tract calcification or dilatation. No renal masses. Stomach/Bowel: Appendix not visualized, no pericecal inflammatory process seen. Stomach decompressed. Mild sigmoid diverticulosis without evidence of diverticulitis. Remaining bowel loops unremarkable. Vascular/Lymphatic: Few pelvic phleboliths. Atherosclerotic  calcifications aorta and iliac arteries without aneurysm. No adenopathy. Reproductive: Mild prostatic enlargement, gland measuring 5.0 x 3.8 cm. Other: Small LEFT  inguinal hernia containing fat. Mild edema along the RIGHT spermatic cord without hernia, nonspecific. No free air or free fluid. Small umbilical hernia containing fat. Musculoskeletal: Degenerative disc and facet disease changes lumbar spine. Grade 1 anterolisthesis L4-L5. Mild degenerative changes of the hip joints. IMPRESSION: Mild prostatic enlargement. Small LEFT inguinal and umbilical hernias containing fat. Mild sigmoid diverticulosis without evidence of diverticulitis. Mild nonspecific edema is seen along the RIGHT spermatic cord through the RIGHT inguinal canal, nonspecific; this could reflect inflammatory process but also could be seen with testicular torsion; recommend correlation with physical exam and if clinically indicated scrotal ultrasound with scrotal Doppler. Aortic Atherosclerosis (ICD10-I70.0). Electronically Signed   By: Ulyses Southward M.D.   On: 03/02/2020 17:51   US SCROTUM W/DOPPLER  Result Date: 03/02/2020 CLINICAL DATA:  Testicular pain and swelling for 4 days EXAM: SCROTAL ULTRASOUND DOPPLER ULTRASOUND OF THE TESTICLES TECHNIQUE: Complete ultrasound examination of the testicles, epididymis, and other scrotal structures was performed. Color and spectral Doppler ultrasound were also utilized to evaluate blood flow to the testicles. COMPARISON:  01/30/2020 FINDINGS: Right testicle Measurements: 4.4 x 3.1 by 3.4 cm. No mass or microlithiasis visualized. Left testicle Measurements: 3.9 x 2.0 by 2.6 cm. Echotexture is grossly normal. The hypoechoic region seen within the left testicle and prior study has nearly completely resolved, now measuring only 2 x 2 x 1 mm, likely sequela of previous inflammation/infection. Right epididymis: Mildly enlarged compared to the left, measuring 14 x 8 by 14 mm. Increased vascularity. Left epididymis:  Normal in size and appearance. Hydrocele: Complex right hydrocele is identified with numerous septations. Varicocele:  None visualized. Pulsed Doppler  interrogation of both testes demonstrates normal low resistance arterial and venous waveforms bilaterally. The right testicle and epididymis are hypervascular compared to the left, likely due to underlying infection. IMPRESSION: 1. Hypervascular right testicle and epididymis, consistent with epididymo-orchitis. No evidence of intra testicular abscess. 2. Complex right hydrocele, with multiple septations noted. 3. Postinflammatory/postinfectious change within the left testicle, with no residual fluid collection compared to prior study. Electronically Signed   By: Sharlet Salina M.D.   On: 03/02/2020 17:27   Labs on Admission: I have personally reviewed following labs  CBC: Recent Labs  Lab 03/02/20 1348  WBC 21.2*  HGB 13.8  HCT 39.2  MCV 90.3  PLT 281   Basic Metabolic Panel: Recent Labs  Lab 03/02/20 1348  NA 135  K 4.0  CL 100  CO2 21*  GLUCOSE 199*  BUN 16  CREATININE 1.11  CALCIUM 9.5   GFR: Estimated Creatinine Clearance: 70.9 mL/min (by C-G formula based on SCr of 1.11 mg/dL). Liver Function Tests: Recent Labs  Lab 03/02/20 1348  AST 19  ALT 20  ALKPHOS 83  BILITOT 1.3*  PROT 7.8  ALBUMIN 3.8   Urine analysis:    Component Value Date/Time   COLORURINE YELLOW (A) 03/02/2020 1348   APPEARANCEUR TURBID (A) 03/02/2020 1348   LABSPEC 1.017 03/02/2020 1348   PHURINE 5.0 03/02/2020 1348   GLUCOSEU NEGATIVE 03/02/2020 1348   HGBUR NEGATIVE 03/02/2020 1348   BILIRUBINUR NEGATIVE 03/02/2020 1348   KETONESUR 5 (A) 03/02/2020 1348   PROTEINUR 100 (A) 03/02/2020 1348   NITRITE NEGATIVE 03/02/2020 1348   LEUKOCYTESUR LARGE (A) 03/02/2020 1348   Varina Hulon N Tyress Loden D.O. [facility]  If 12AM-7AM, please contact overnight-coverage provider If 7AM-7PM, please contact  day coverage provider [email or web address]  03/03/2020, 12:59 AM

## 2020-03-02 NOTE — ED Triage Notes (Signed)
Multiple medical complaints. Patient complains of a known UTI, also states back pain on the left lower side that radiates around the side and into his groin. States both testicles are swollen. Testicular history and tests performed in this ER "are on record." [facility]

## 2020-03-02 NOTE — Progress Notes (Signed)
Code Sepsis. Elink following. Initiated at 1627 PM Douglas Humphrey eLink RN

## 2020-03-02 NOTE — ED Notes (Signed)
Pt ambulated to restroom with no assistance. ?

## 2020-03-02 NOTE — ED Provider Notes (Signed)
[facility] Emergency Department Provider Note   ____________________________________________    I have reviewed the triage vital signs and the nursing notes.   HISTORY  Chief Complaint Recurrent UTI, Groin Swelling, and Back Pain (and flank pain)     HPI Douglas Humphrey is a 69 y.o. male who presents with cloudy urine, dysuria, scrotal swelling as well as right-sided back pain.  Patient reports the symptoms developed on Thursday.  He reports several weeks ago he was treated for UTI with orchitis which did seem to improve, symptoms returned on Thursday.  Developed back pain on Friday.  Does not think that he has had a fever.  Loss of appetite, mild nausea no vomiting.  Also reports a cough which is new.   Past Medical History:  Diagnosis Date  . Chronic back pain   . Crohn's disease (HCC)   . Leaky heart valve     Patient Active Problem List   Diagnosis Date Noted  . Shortness of breath 05/14/2015    Past Surgical History:  Procedure Laterality Date  . NO PAST SURGERIES      Prior to Admission medications   Medication Sig Start Date End Date Taking? Authorizing Provider  aspirin 81 MG chewable tablet Chew 1 tablet (81 mg total) by mouth daily. 05/15/15   Auburn Bilberry, MD  doxycycline (VIBRA-TABS) 100 MG tablet Take 100 mg by mouth 2 (two) times daily. 01/31/20   [provider]  glucosamine-chondroitin 500-400 MG tablet Take 1 tablet by mouth at bedtime.    [provider]  Guaifenesin 1200 MG TB12 Take 1,200 mg by mouth at bedtime as needed (for cough).    [provider]  metoprolol succinate (TOPROL-XL) 25 MG 24 hr tablet Take 25 mg by mouth daily. 12/22/19   [provider]  Multiple Vitamin (MULTIVITAMIN WITH MINERALS) TABS tablet Take 1 tablet by mouth at bedtime.    [provider]  polycarbophil (FIBERCON) 625 MG tablet Take 625 mg by mouth at bedtime.    [provider]   rosuvastatin (CRESTOR) 10 MG tablet Take 10 mg by mouth daily. 12/31/19   [provider]  spironolactone (ALDACTONE) 25 MG tablet Take 25 mg by mouth daily. 01/14/20   [provider]     Allergies Patient has no known allergies.  Family History  Problem Relation Age of Onset  . CAD Mother   . COPD Mother   . CAD Father     Social History Social History   Tobacco Use  . Smoking status: Never Smoker  . Smokeless tobacco: Never Used  Vaping Use  . Vaping Use: Never used  Substance Use Topics  . Alcohol use: No  . Drug use: No    Review of Systems  Constitutional: No fever/chills Eyes: No visual changes.  ENT: No sore throat. Cardiovascular: Denies chest pain. Respiratory: Denies shortness of breath.  Positive cough Gastrointestinal: As above Genitourinary: As above, right scrotal swelling Musculoskeletal: As above Skin: Negative for rash. Neurological: Negative for headaches or weakness   ____________________________________________   PHYSICAL EXAM:  VITAL SIGNS: ED Triage Vitals  Enc Vitals Group     BP 03/02/20 1342 140/79     Pulse Rate 03/02/20 1342 (!) 123     Resp 03/02/20 1342 (!) 22     Temp 03/02/20 1342 98.7 F (37.1 C)     Temp Source 03/02/20 1342 Oral     SpO2 03/02/20 1342 94 %  Weight 03/02/20 1345 90.7 kg (200 lb)     Height 03/02/20 1345 1.753 m (5\' 9" )     Head Circumference --      Peak Flow --      Pain Score 03/02/20 1344 8     Pain Loc --      Pain Edu? --      Excl. in GC? --     Constitutional: Alert and oriented.  Nose: No congestion/rhinnorhea. Mouth/Throat: Mucous membranes are moist.    Cardiovascular: Tachycardia, regular rhythm. Grossly normal heart sounds.  Good peripheral circulation. Respiratory: Normal respiratory effort.  No retractions. Lungs CTAB. Gastrointestinal: Soft and nontender. No distention.  Right CVA tenderness GU: Scrotal swelling, tender testicle particular on the right,  swollen with mild erythema, no penile discharge Musculoskeletal: No lower extremity tenderness nor edema.  Warm and well perfused Neurologic:  Normal speech and language. No gross focal neurologic deficits are appreciated.  Skin:  Skin is warm, dry and intact. No rash noted. Psychiatric: Mood and affect are normal. Speech and behavior are normal.  ____________________________________________   LABS (all labs ordered are listed, but only abnormal results are displayed)  Labs Reviewed  CBC - Abnormal; Notable for the following components:      Result Value   WBC 21.2 (*)    All other components within normal limits  COMPREHENSIVE METABOLIC PANEL - Abnormal; Notable for the following components:   CO2 21 (*)    Glucose, Bld 199 (*)    Total Bilirubin 1.3 (*)    All other components within normal limits  URINALYSIS, COMPLETE (UACMP) WITH MICROSCOPIC - Abnormal; Notable for the following components:   Color, Urine YELLOW (*)    APPearance TURBID (*)    Ketones, ur 5 (*)    Protein, ur 100 (*)    Leukocytes,Ua LARGE (*)    WBC, UA >50 (*)    Bacteria, UA MANY (*)    All other components within normal limits  CULTURE, BLOOD (ROUTINE X 2)  CULTURE, BLOOD (ROUTINE X 2)  RESPIRATORY PANEL BY RT PCR (FLU A&B, COVID)  URINE CULTURE  LACTIC ACID, PLASMA  LACTIC ACID, PLASMA   ____________________________________________  EKG  None ____________________________________________  RADIOLOGY  Ultrasound scrotum Chest x-ray reviewed by me, no clear pneumonia CT renal stone study ____________________________________________   PROCEDURES  Procedure(s) performed: No  Procedures   Critical Care performed: No ____________________________________________   INITIAL IMPRESSION / ASSESSMENT AND PLAN / ED COURSE  Pertinent labs & imaging results that were available during my care of the patient were reviewed by me and considered in my medical decision making (see chart for  details).  Patient presents with symptoms of UTI with likely orchitis and also CVA tenderness suspicious for pyelonephritis.  Lab work is notable for urinary tract infection on urinalysis, white blood cell count of 20,000, given tachycardia, concern for sepsis.  Code sepsis activated, lactic ordered, blood cultures, IV fluids, Rocephin  We will obtain CT renal stone study, chest x-ray given cough and ultrasound scrotum.  Patient is not vaccinated gets COVID-19.  Denies loss of taste or smell, no body aches reported  Lactic acid is reassuring  Ultrasound demonstrates likely epididymal orchitis on the right primarily, no abscess  CT scan overall reassuring, spermatic cord inflammation consistent with epididymal orchitis  Discussed with hospitalist for admission    ____________________________________________   FINAL CLINICAL IMPRESSION(S) / ED DIAGNOSES  Final diagnoses:  Testicle swelling  Pain in both testicles  Note:  This document was prepared using Dragon voice recognition software and may include unintentional dictation errors.   Jene Every, MD 03/02/20 [phone]

## 2020-03-02 NOTE — ED Notes (Signed)
Patient transported to Ultrasound and CT.

## 2020-03-03 ENCOUNTER — Other Ambulatory Visit: Payer: Self-pay

## 2020-03-03 DIAGNOSIS — E785 Hyperlipidemia, unspecified: Secondary | ICD-10-CM | POA: Diagnosis present

## 2020-03-03 DIAGNOSIS — Z825 Family history of asthma and other chronic lower respiratory diseases: Secondary | ICD-10-CM | POA: Diagnosis not present

## 2020-03-03 DIAGNOSIS — Z20822 Contact with and (suspected) exposure to covid-19: Secondary | ICD-10-CM | POA: Diagnosis present

## 2020-03-03 DIAGNOSIS — Z7982 Long term (current) use of aspirin: Secondary | ICD-10-CM | POA: Diagnosis not present

## 2020-03-03 DIAGNOSIS — Z87438 Personal history of other diseases of male genital organs: Secondary | ICD-10-CM

## 2020-03-03 DIAGNOSIS — L0390 Cellulitis, unspecified: Secondary | ICD-10-CM | POA: Diagnosis present

## 2020-03-03 DIAGNOSIS — R630 Anorexia: Secondary | ICD-10-CM | POA: Diagnosis present

## 2020-03-03 DIAGNOSIS — I5032 Chronic diastolic (congestive) heart failure: Secondary | ICD-10-CM

## 2020-03-03 DIAGNOSIS — Z6829 Body mass index (BMI) 29.0-29.9, adult: Secondary | ICD-10-CM | POA: Diagnosis not present

## 2020-03-03 DIAGNOSIS — N453 Epididymo-orchitis: Secondary | ICD-10-CM | POA: Diagnosis present

## 2020-03-03 DIAGNOSIS — B9620 Unspecified Escherichia coli [E. coli] as the cause of diseases classified elsewhere: Secondary | ICD-10-CM | POA: Diagnosis present

## 2020-03-03 DIAGNOSIS — G8929 Other chronic pain: Secondary | ICD-10-CM | POA: Diagnosis present

## 2020-03-03 DIAGNOSIS — I110 Hypertensive heart disease with heart failure: Secondary | ICD-10-CM | POA: Diagnosis present

## 2020-03-03 DIAGNOSIS — N400 Enlarged prostate without lower urinary tract symptoms: Secondary | ICD-10-CM | POA: Diagnosis present

## 2020-03-03 DIAGNOSIS — I10 Essential (primary) hypertension: Secondary | ICD-10-CM | POA: Diagnosis not present

## 2020-03-03 DIAGNOSIS — Z79899 Other long term (current) drug therapy: Secondary | ICD-10-CM | POA: Diagnosis not present

## 2020-03-03 DIAGNOSIS — N12 Tubulo-interstitial nephritis, not specified as acute or chronic: Secondary | ICD-10-CM | POA: Diagnosis present

## 2020-03-03 DIAGNOSIS — R1909 Other intra-abdominal and pelvic swelling, mass and lump: Secondary | ICD-10-CM | POA: Diagnosis present

## 2020-03-03 DIAGNOSIS — N433 Hydrocele, unspecified: Secondary | ICD-10-CM | POA: Diagnosis present

## 2020-03-03 DIAGNOSIS — I700 Atherosclerosis of aorta: Secondary | ICD-10-CM | POA: Diagnosis present

## 2020-03-03 DIAGNOSIS — Z87440 Personal history of urinary (tract) infections: Secondary | ICD-10-CM | POA: Diagnosis not present

## 2020-03-03 DIAGNOSIS — K5090 Crohn's disease, unspecified, without complications: Secondary | ICD-10-CM | POA: Diagnosis present

## 2020-03-03 LAB — BASIC METABOLIC PANEL
Anion gap: 8 (ref 5–15)
BUN: 15 mg/dL (ref 8–23)
CO2: 28 mmol/L (ref 22–32)
Calcium: 9 mg/dL (ref 8.9–10.3)
Chloride: 100 mmol/L (ref 98–111)
Creatinine, Ser: 1.09 mg/dL (ref 0.61–1.24)
GFR, Estimated: >60 mL/min (ref >60)
Glucose, Bld: 179 mg/dL — ABNORMAL HIGH (ref 70–99)
Potassium: 3.8 mmol/L (ref 3.5–5.1)
Sodium: 136 mmol/L (ref 135–145)

## 2020-03-03 LAB — RPR: RPR Ser Ql: NONREACTIVE

## 2020-03-03 LAB — CBC
HCT: 34.7 % — ABNORMAL LOW (ref 39.0–52.0)
Hemoglobin: 12.2 g/dL — ABNORMAL LOW (ref 13.0–17.0)
MCH: 32.4 pg (ref 26.0–34.0)
MCHC: 35.2 g/dL (ref 30.0–36.0)
MCV: 92 fL (ref 80.0–100.0)
Platelets: 279 10*3/uL (ref 150–400)
RBC: 3.77 MIL/uL — ABNORMAL LOW (ref 4.22–5.81)
RDW: 13.7 % (ref 11.5–15.5)
WBC: 18.7 10*3/uL — ABNORMAL HIGH (ref 4.0–10.5)
nRBC: 0 % (ref 0.0–0.2)

## 2020-03-03 LAB — CHLAMYDIA/NGC RT PCR (ARMC ONLY)
Chlamydia Tr: NOT DETECTED
N gonorrhoeae: NOT DETECTED

## 2020-03-03 LAB — TSH: TSH: 1.449 u[IU]/mL (ref 0.350–4.500)

## 2020-03-03 LAB — HIV ANTIBODY (ROUTINE TESTING W REFLEX): HIV Screen 4th Generation wRfx: NONREACTIVE

## 2020-03-03 LAB — MRSA PCR SCREENING: MRSA by PCR: NEGATIVE

## 2020-03-03 MED ORDER — ROSUVASTATIN CALCIUM 10 MG PO TABS
10.0000 mg | ORAL_TABLET | Freq: Every day | ORAL | Status: DC
  Administered 2020-03-03 – 2020-03-06 (×4): 10 mg via ORAL
  Filled 2020-03-03 (×7): qty 1

## 2020-03-03 MED ORDER — SODIUM CHLORIDE 0.9 % IV SOLN: INTRAVENOUS | Status: AC

## 2020-03-03 MED ORDER — ENOXAPARIN SODIUM 40 MG/0.4ML ~~LOC~~ SOLN
40.0000 mg | SUBCUTANEOUS | Status: DC
  Administered 2020-03-03 – 2020-03-06 (×4): 40 mg via SUBCUTANEOUS
  Filled 2020-03-03 (×4): qty 0.4

## 2020-03-03 MED ORDER — METOPROLOL SUCCINATE ER 25 MG PO TB24
25.0000 mg | ORAL_TABLET | Freq: Every day | ORAL | Status: DC
  Administered 2020-03-03 – 2020-03-06 (×4): 25 mg via ORAL
  Filled 2020-03-03 (×4): qty 1

## 2020-03-03 MED ORDER — MORPHINE SULFATE (PF) 2 MG/ML IV SOLN: 2.0000 mg | INTRAVENOUS | Status: DC | PRN

## 2020-03-03 MED ORDER — ONDANSETRON HCL 4 MG/2ML IJ SOLN: 4.0000 mg | Freq: Three times a day (TID) | INTRAMUSCULAR | Status: AC | PRN

## 2020-03-03 MED ORDER — SPIRONOLACTONE 25 MG PO TABS
25.0000 mg | ORAL_TABLET | Freq: Every day | ORAL | Status: DC
  Administered 2020-03-03 – 2020-03-06 (×4): 25 mg via ORAL
  Filled 2020-03-03 (×6): qty 1

## 2020-03-03 MED ORDER — KETOROLAC TROMETHAMINE 30 MG/ML IJ SOLN
15.0000 mg | Freq: Four times a day (QID) | INTRAMUSCULAR | Status: DC
  Administered 2020-03-03 – 2020-03-06 (×12): 15 mg via INTRAVENOUS
  Filled 2020-03-03 (×13): qty 1

## 2020-03-03 MED ORDER — ACETAMINOPHEN 325 MG PO TABS
650.0000 mg | ORAL_TABLET | Freq: Four times a day (QID) | ORAL | Status: DC | PRN
  Administered 2020-03-03: 650 mg via ORAL
  Filled 2020-03-03: qty 2

## 2020-03-03 MED ORDER — SODIUM CHLORIDE 0.9 % IV SOLN
1.0000 g | INTRAVENOUS | Status: DC
  Administered 2020-03-03 – 2020-03-05 (×3): 1 g via INTRAVENOUS
  Filled 2020-03-03: qty 10
  Filled 2020-03-03: qty 1
  Filled 2020-03-03 (×2): qty 10

## 2020-03-03 NOTE — ED Notes (Signed)
Pt resting in bed. NAD noted, lights dimmed and call bell in reach.

## 2020-03-03 NOTE — Progress Notes (Signed)
PROGRESS NOTE    Douglas Humphrey  ZOX:[id number] DOB: 1950/09/05 DOA: 03/02/2020 PCP: [name], MD    Brief Narrative:  Douglas Humphrey is a 69 y.o. male with medical history significant for hypertension, grade 1 diastolic dysfunction, (echo in 2017), history of left orchitis, presents to the emergency department for chief concerns of RIGHT scrotal pain. He reports right testicular pain started Thursday, 02/28/2020, and then radiated to the right lower quadrant abdominal pain that started Thursday, 02/28/20 and progressively gotten worse.  He further endorses right back pain.  Denies fever/chills/diarrhea/constipaton/blood in stool/nausea/vomitting/weight loss/headache/visionchanges/dysphagia/odynophagia.  11/8: Patient reports improvement in right testicular pain and swelling since initiation of intravenous fluids and antibiotics.  Scrotal ultrasound negative for torsion, demonstrates epididymoorchitis.   Assessment & Plan:   Active Problems:   Pyelonephritis   H/O orchitis   Cellulitis   Diastolic CHF, chronic (HCC)   Essential hypertension  Right scrotal pain Right epididymoorchitis Initial concern for pyelonephritis however stone study negative Suspect infectious epididymoorchitis Plan: Continue intravenous ceftriaxone Supplemental IV fluids Pain control as needed Antiemetics as needed Follow blood cultures Follow-up infectious STI work-up  Chronic diastolic congestive heart failure Noted on echocardiogram 2017 Euvolemic at this time No indication to repeat echo Continue Aldactone 25 mg daily Continue Toprol-XL 25 mg daily  Hypertension Metoprolol and Aldactone  Hyperlipidemia Continue statin    DVT prophylaxis: Lovenox Code Status: Full Family Communication: None today Disposition Plan: Status is: Observation  The patient will require care spanning > 2 midnights and should be moved to inpatient because: IV treatments appropriate due to intensity of illness  or inability to take PO and Inpatient level of care appropriate due to severity of illness  Dispo: The patient is from: Home              Anticipated d/c is to: Home              Anticipated d/c date is: 2 days              Patient currently is not medically stable to d/c.  Still with significant scrotal pain and swelling secondary to epididymoorchitis.  Anticipate 48 hours additional prior to disposition planning.   Consultants:   None  Procedures:   None  Antimicrobials:   Ceftriaxone   Subjective: Seen and examined.  Continues to endorse right scrotal pain and swelling but improved from prior.  Objective: Vitals:   03/03/20 0909 03/03/20 0912 03/03/20 0942 03/03/20 1113  BP: 120/69  106/62 118/74  Pulse: 91  91 89  Resp:  Temp:   98.4 F (36.9 C) 97.9 F (36.6 C)  TempSrc:   Oral Oral  SpO2: 93%  95% 95%  Weight:      Height:        Intake/Output Summary (Last 24 hours) at 03/03/2020 1121 Last data filed at 03/03/2020 0829 Gross per 24 hour  Intake --  Output 650 ml  Net -650 ml   Filed Weights   03/02/20 1345  Weight: 90.7 kg    Examination:  General exam: Appears calm and comfortable  Respiratory system: Clear to auscultation. Respiratory effort normal. Cardiovascular system: S1-S2 heard, 2/6 systolic murmur, no pedal edema  gastrointestinal system: Abdomen is nondistended, soft and nontender. No organomegaly or masses felt. Normal bowel sounds heard. Genitourinary system: Right-sided testicular and scrotal pain.  Swollen on exam Central nervous system: Alert and oriented. No focal neurological deficits. Extremities: Symmetric 5 x 5 power. Skin: No rashes, lesions  or ulcers Psychiatry: Judgement and insight appear normal. Mood & affect appropriate.     Data Reviewed: I have personally reviewed following labs and imaging studies  CBC: Recent Labs  Lab 03/02/20 1348 03/03/20 0452  WBC 21.2* 18.7*  HGB 13.8 12.2*  HCT 39.2 34.7*   MCV 90.3 92.0  PLT 281 279   Basic Metabolic Panel: Recent Labs  Lab 03/02/20 1348 03/03/20 0452  NA 135 136  K 4.0 3.8  CL 100 100  CO2 21* 28  GLUCOSE 199* 179*  BUN 16 15  CREATININE 1.11 1.09  CALCIUM 9.5 9.0   GFR: Estimated Creatinine Clearance: 72.2 mL/min (by C-G formula based on SCr of 1.09 mg/dL). Liver Function Tests: Recent Labs  Lab 03/02/20 1348  AST 19  ALT 20  ALKPHOS 83  BILITOT 1.3*  PROT 7.8  ALBUMIN 3.8   No results for input(s): LIPASE, AMYLASE in the last 168 hours. No results for input(s): AMMONIA in the last 168 hours. Coagulation Profile: No results for input(s): INR, PROTIME in the last 168 hours. Cardiac Enzymes: No results for input(s): CKTOTAL, CKMB, CKMBINDEX, TROPONINI in the last 168 hours. BNP (last 3 results) No results for input(s): PROBNP in the last 8760 hours. HbA1C: No results for input(s): HGBA1C in the last 72 hours. CBG: No results for input(s): GLUCAP in the last 168 hours. Lipid Profile: No results for input(s): CHOL, HDL, LDLCALC, TRIG, CHOLHDL, LDLDIRECT in the last 72 hours. Thyroid Function Tests: Recent Labs    03/03/20 0452  TSH 1.449   Anemia Panel: No results for input(s): VITAMINB12, FOLATE, FERRITIN, TIBC, IRON, RETICCTPCT in the last 72 hours. Sepsis Labs: Recent Labs  Lab 03/02/20 1627 03/02/20 1817  LATICACIDVEN 1.5 1.2    Recent Results (from the past 240 hour(s))  Respiratory Panel by RT PCR (Flu A&B, Covid) - Nasopharyngeal Swab     Status: None   Collection Time: 03/02/20  4:22 PM   Specimen: Nasopharyngeal Swab  Result Value Ref Range Status   SARS Coronavirus 2 by RT PCR NEGATIVE NEGATIVE Final    Comment: (NOTE) SARS-CoV-2 target nucleic acids are NOT DETECTED.  The SARS-CoV-2 RNA is generally detectable in upper respiratoy specimens during the acute phase of infection. The lowest concentration of SARS-CoV-2 viral copies this assay can detect is 131 copies/mL. A negative result  does not preclude SARS-Cov-2 infection and should not be used as the sole basis for treatment or other patient management decisions. A negative result may occur with  improper specimen collection/handling, submission of specimen other than nasopharyngeal swab, presence of viral mutation(s) within the areas targeted by this assay, and inadequate number of viral copies (<131 copies/mL). A negative result must be combined with clinical observations, patient history, and epidemiological information. The expected result is Negative.  Fact Sheet for Patients:  https://www.moore.com/  Fact Sheet for Healthcare Providers:  https://www.young.biz/  This test is no t yet approved or cleared by the [place] FDA and  has been authorized for detection and/or diagnosis of SARS-CoV-2 by FDA under an Emergency Use Authorization (EUA). This EUA will remain  in effect (meaning this test can be used) for the duration of the COVID-19 declaration under Section 564(b)(1) of the Act, 21 U.S.C. section 360bbb-3(b)(1), unless the authorization is terminated or revoked sooner.     Influenza A by PCR NEGATIVE NEGATIVE Final   Influenza B by PCR NEGATIVE NEGATIVE Final    Comment: (NOTE) The Xpert Xpress SARS-CoV-2/FLU/RSV assay is intended as an aid  in  the diagnosis of influenza from Nasopharyngeal swab specimens and  should not be used as a sole basis for treatment. Nasal washings and  aspirates are unacceptable for Xpert Xpress SARS-CoV-2/FLU/RSV  testing.  Fact Sheet for Patients: https://www.moore.com/  Fact Sheet for Healthcare Providers: https://www.young.biz/  This test is not yet approved or cleared by the [place] FDA and  has been authorized for detection and/or diagnosis of SARS-CoV-2 by  FDA under an Emergency Use Authorization (EUA). This EUA will remain  in effect (meaning this test can be used) for  the duration of the  Covid-19 declaration under Section 564(b)(1) of the Act, 21  U.S.C. section 360bbb-3(b)(1), unless the authorization is  terminated or revoked. Performed at [facility], [address] [place], [place] [address]   Blood culture (routine x 2)     Status: None (Preliminary result)   Collection Time: 03/02/20  4:27 PM   Specimen: BLOOD  Result Value Ref Range Status   Specimen Description BLOOD LAC  Final   Special Requests   Final    BOTTLES DRAWN AEROBIC AND ANAEROBIC Blood Culture adequate volume   Culture   Final    NO GROWTH < 12 HOURS Performed at [facility], [address] [place], [place] [address]    Report Status PENDING  Incomplete  Blood culture (routine x 2)     Status: None (Preliminary result)   Collection Time: 03/02/20  4:27 PM   Specimen: BLOOD  Result Value Ref Range Status   Specimen Description BLOOD RAC  Final   Special Requests   Final    BOTTLES DRAWN AEROBIC AND ANAEROBIC Blood Culture adequate volume   Culture   Final    NO GROWTH < 12 HOURS Performed at [facility], [address] [place], [place] [address]    Report Status PENDING  Incomplete  Chlamydia/NGC rt PCR ([facility] only)     Status: None   Collection Time: 03/03/20  4:52 AM   Specimen: Urine  Result Value Ref Range Status   Specimen source GC/Chlam URINE, RANDOM  Final   Chlamydia Tr NOT DETECTED NOT DETECTED Final   N gonorrhoeae NOT DETECTED NOT DETECTED Final    Comment: (NOTE) This CT/NG assay has not been evaluated in patients with a history of  hysterectomy. Performed at [facility], [address] [place], [place] [address]   MRSA PCR Screening     Status: None   Collection Time: 03/03/20  4:52 AM   Specimen: Nasal Mucosa; Nasopharyngeal  Result Value Ref Range Status   MRSA by PCR NEGATIVE NEGATIVE Final    Comment:        The GeneXpert MRSA Assay (FDA approved for NASAL specimens only), is one component of  a comprehensive MRSA colonization surveillance program. It is not intended to diagnose MRSA infection nor to guide or monitor treatment for MRSA infections. Performed at [facility], [address], [place], [place] [address]          Radiology Studies: DG Chest [place] 1 View  Result Date: 03/02/2020 CLINICAL DATA:  70 year old male with history of cough and shortness of breath. EXAM: PORTABLE CHEST 1 VIEW COMPARISON:  Chest x-ray 12/12/2019. FINDINGS: Lung volumes are normal. No consolidative airspace disease. No pleural effusions. No pneumothorax. No pulmonary nodule or mass noted. Pulmonary vasculature and the cardiomediastinal silhouette are within normal limits. IMPRESSION: No radiographic evidence of acute cardiopulmonary disease. Electronically Signed   By: Trudie Reed  M.D.   On: 03/02/2020 18:07   CT Renal Stone Study  Result Date: 03/02/2020 CLINICAL DATA:  UTI, LEFT side lower back pain radiating around to side and to groin, BILATERAL testicular swelling, history Crohn's disease EXAM: CT ABDOMEN AND PELVIS WITHOUT CONTRAST TECHNIQUE: Multidetector CT imaging of the abdomen and pelvis was performed following the standard protocol without IV contrast. Sagittal and coronal MPR images reconstructed from axial data set. No oral contrast administered COMPARISON:  None FINDINGS: Lower chest: Minimal atelectasis or scarring at lung bases Hepatobiliary: Gallbladder and liver normal appearance Pancreas: Normal appearance Spleen: Normal appearance Adrenals/Urinary Tract: Adrenal glands normal appearance. Kidneys, ureters, and bladder normal appearance. No urinary tract calcification or dilatation. No renal masses. Stomach/Bowel: Appendix not visualized, no pericecal inflammatory process seen. Stomach decompressed. Mild sigmoid diverticulosis without evidence of diverticulitis. Remaining bowel loops unremarkable. Vascular/Lymphatic: Few pelvic phleboliths. Atherosclerotic  calcifications aorta and iliac arteries without aneurysm. No adenopathy. Reproductive: Mild prostatic enlargement, gland measuring 5.0 x 3.8 cm. Other: Small LEFT inguinal hernia containing fat. Mild edema along the RIGHT spermatic cord without hernia, nonspecific. No free air or free fluid. Small umbilical hernia containing fat. Musculoskeletal: Degenerative disc and facet disease changes lumbar spine. Grade 1 anterolisthesis L4-L5. Mild degenerative changes of the hip joints. IMPRESSION: Mild prostatic enlargement. Small LEFT inguinal and umbilical hernias containing fat. Mild sigmoid diverticulosis without evidence of diverticulitis. Mild nonspecific edema is seen along the RIGHT spermatic cord through the RIGHT inguinal canal, nonspecific; this could reflect inflammatory process but also could be seen with testicular torsion; recommend correlation with physical exam and if clinically indicated scrotal ultrasound with scrotal Doppler. Aortic Atherosclerosis (ICD10-I70.0). Electronically Signed   By: Ulyses Southward M.D.   On: 03/02/2020 17:51   US SCROTUM W/DOPPLER  Result Date: 03/02/2020 CLINICAL DATA:  Testicular pain and swelling for 4 days EXAM: SCROTAL ULTRASOUND DOPPLER ULTRASOUND OF THE TESTICLES TECHNIQUE: Complete ultrasound examination of the testicles, epididymis, and other scrotal structures was performed. Color and spectral Doppler ultrasound were also utilized to evaluate blood flow to the testicles. COMPARISON:  01/30/2020 FINDINGS: Right testicle Measurements: 4.4 x 3.1 by 3.4 cm. No mass or microlithiasis visualized. Left testicle Measurements: 3.9 x 2.0 by 2.6 cm. Echotexture is grossly normal. The hypoechoic region seen within the left testicle and prior study has nearly completely resolved, now measuring only 2 x 2 x 1 mm, likely sequela of previous inflammation/infection. Right epididymis: Mildly enlarged compared to the left, measuring 14 x 8 by 14 mm. Increased vascularity. Left  epididymis:  Normal in size and appearance. Hydrocele: Complex right hydrocele is identified with numerous septations. Varicocele:  None visualized. Pulsed Doppler interrogation of both testes demonstrates normal low resistance arterial and venous waveforms bilaterally. The right testicle and epididymis are hypervascular compared to the left, likely due to underlying infection. IMPRESSION: 1. Hypervascular right testicle and epididymis, consistent with epididymo-orchitis. No evidence of intra testicular abscess. 2. Complex right hydrocele, with multiple septations noted. 3. Postinflammatory/postinfectious change within the left testicle, with no residual fluid collection compared to prior study. Electronically Signed   By: Sharlet Salina M.D.   On: 03/02/2020 17:27        Scheduled Meds: . enoxaparin (LOVENOX) injection  40 mg Subcutaneous Q24H  . ketorolac  15 mg Intravenous Q6H  . metoprolol succinate  25 mg Oral Daily  . rosuvastatin  10 mg Oral Daily  . spironolactone  25 mg Oral Daily   Continuous Infusions: . sodium chloride 100 mL/hr at 03/03/20 0133  .  cefTRIAXone (ROCEPHIN)  IV       LOS: 0 days    Time spent: 25 minutes    Tresa Moore, MD [facility] Pager [phone]  If 7PM-7AM, please contact night-coverage 03/03/2020, 11:21 AM

## 2020-03-03 NOTE — ED Notes (Signed)
Pt resting at this time, RR even and unlabored.

## 2020-03-04 DIAGNOSIS — N453 Epididymo-orchitis: Secondary | ICD-10-CM | POA: Diagnosis not present

## 2020-03-04 DIAGNOSIS — I5032 Chronic diastolic (congestive) heart failure: Secondary | ICD-10-CM | POA: Diagnosis not present

## 2020-03-04 LAB — CBC WITH DIFFERENTIAL/PLATELET
Abs Immature Granulocytes: 0.09 10*3/uL — ABNORMAL HIGH (ref 0.00–0.07)
Basophils Absolute: 0.1 10*3/uL (ref 0.0–0.1)
Basophils Relative: 0 %
Eosinophils Absolute: 0.2 10*3/uL (ref 0.0–0.5)
Eosinophils Relative: 1 %
HCT: 34.1 % — ABNORMAL LOW (ref 39.0–52.0)
Hemoglobin: 12 g/dL — ABNORMAL LOW (ref 13.0–17.0)
Immature Granulocytes: 1 %
Lymphocytes Relative: 10 %
Lymphs Abs: 1.4 10*3/uL (ref 0.7–4.0)
MCH: 32.2 pg (ref 26.0–34.0)
MCHC: 35.2 g/dL (ref 30.0–36.0)
MCV: 91.4 fL (ref 80.0–100.0)
Monocytes Absolute: 0.9 10*3/uL (ref 0.1–1.0)
Monocytes Relative: 6 %
Neutro Abs: 11.8 10*3/uL — ABNORMAL HIGH (ref 1.7–7.7)
Neutrophils Relative %: 82 %
Platelets: 289 10*3/uL (ref 150–400)
RBC: 3.73 MIL/uL — ABNORMAL LOW (ref 4.22–5.81)
RDW: 13.4 % (ref 11.5–15.5)
WBC: 14.5 10*3/uL — ABNORMAL HIGH (ref 4.0–10.5)
nRBC: 0 % (ref 0.0–0.2)

## 2020-03-04 LAB — BASIC METABOLIC PANEL
Anion gap: 10 (ref 5–15)
BUN: 16 mg/dL (ref 8–23)
CO2: 25 mmol/L (ref 22–32)
Calcium: 8.9 mg/dL (ref 8.9–10.3)
Chloride: 103 mmol/L (ref 98–111)
Creatinine, Ser: 1.04 mg/dL (ref 0.61–1.24)
GFR, Estimated: >60 mL/min (ref >60)
Glucose, Bld: 134 mg/dL — ABNORMAL HIGH (ref 70–99)
Potassium: 3.9 mmol/L (ref 3.5–5.1)
Sodium: 138 mmol/L (ref 135–145)

## 2020-03-04 NOTE — Plan of Care (Signed)

## 2020-03-04 NOTE — Progress Notes (Signed)
PROGRESS NOTE    Douglas Humphrey  ZOX:[id number] DOB: [birth year] DOA: 03/02/2020 PCP: [name], MD    Brief Narrative:  Douglas Humphrey is a 69 y.o. male with medical history significant for hypertension, grade 1 diastolic dysfunction, (echo in 2017), history of left orchitis, presents to the emergency department for chief concerns of RIGHT scrotal pain. He reports right testicular pain started Thursday, 02/28/2020, and then radiated to the right lower quadrant abdominal pain that started Thursday, 02/28/20 and progressively gotten worse.  He further endorses right back pain.  Denies fever/chills/diarrhea/constipaton/blood in stool/nausea/vomitting/weight loss/headache/visionchanges/dysphagia/odynophagia.  11/8: Patient reports improvement in right testicular pain and swelling since initiation of intravenous fluids and antibiotics.  Scrotal ultrasound negative for torsion, demonstrates epididymoorchitis.  11/9: Exam improving.  No fevers over interval.  White count decreasing   Assessment & Plan:   Active Problems:   Pyelonephritis   H/O orchitis   Cellulitis   Diastolic CHF, chronic (HCC)   Essential hypertension   Epididymoorchitis  Right testicular pain Right epididymoorchitis Initial concern for pyelonephritis however stone study negative Suspect infectious epididymoorchitis as reflected on ultrasound Pain control and scrotal swelling improving over interval STI panel negative  No clear etiology to recurrent orchitis Plan: Continue intravenous ceftriaxone No need for IV fluids at this time.  Encourage p.o. fluid intake Pain control as needed Antiemetics as needed Follow blood cultures, no growth to date Possible discharge in 24 to 48 hours if exam continues to improve If patient worsens consider urology involvement  Chronic diastolic congestive heart failure Noted on echocardiogram 2017 Euvolemic at this time No indication to repeat echo Continue Aldactone 25 mg  daily Continue Toprol-XL 25 mg daily  Hypertension Metoprolol and Aldactone  Hyperlipidemia Continue statin    DVT prophylaxis: Lovenox Code Status: Full Family Communication: None today Disposition Plan: Status is: Inpatient  Remains inpatient appropriate because:IV treatments appropriate due to intensity of illness or inability to take PO and Inpatient level of care appropriate due to severity of illness   Dispo: The patient is from: Home              Anticipated d/c is to: Home              Anticipated d/c date is: 2 days              Patient currently is not medically stable to d/c.   Patient's exam is improving.  Still with some scrotal and testicular swelling associated with pain.  Anticipate 24 to 48 hours more of inpatient treatment and monitoring prior to disposition planning.  Anticipate discharge home.  Consultants:   None  Procedures:   None  Antimicrobials:   Ceftriaxone   Subjective: Seen and examined.  Testicular pain and swelling improved over interval Objective: Vitals:   03/03/20 1955 03/03/20 2301 03/04/20 0417 03/04/20 0720  BP: 136/79 105/68 109/71 122/76  Pulse:  81 80 91  Resp: Temp: 100 F (37.8 C) 98.6 F (37 C) (!) 97.5 F (36.4 C) 98.3 F (36.8 C)  TempSrc: Oral Oral  Oral  SpO2: 98% 93% 95% 98%  Weight:      Height:        Intake/Output Summary (Last 24 hours) at 03/04/2020 1051 Last data filed at 03/04/2020 1008 Gross per 24 hour  Intake 1360 ml  Output 1570 ml  Net -210 ml   Filed Weights   03/02/20 1345  Weight: 90.7 kg    Examination:  General exam:  Appears calm and comfortable  Respiratory system: Clear to auscultation. Respiratory effort normal. Cardiovascular system: S1-S2 heard, 2/6 systolic murmur, no pedal edema  gastrointestinal system: Abdomen is nondistended, soft and nontender. No organomegaly or masses felt. Normal bowel sounds heard. Genitourinary system: Scrotal and testicular swelling  noted, pain on palpation of right testicle Central nervous system: Alert and oriented. No focal neurological deficits. Extremities: Symmetric 5 x 5 power. Skin: No rashes, lesions or ulcers Psychiatry: Judgement and insight appear normal. Mood & affect appropriate.     Data Reviewed: I have personally reviewed following labs and imaging studies  CBC: Recent Labs  Lab 03/02/20 1348 03/03/20 0452 03/04/20 0728  WBC 21.2* 18.7* 14.5*  NEUTROABS  --   --  11.8*  HGB 13.8 12.2* 12.0*  HCT 39.2 34.7* 34.1*  MCV 90.3 92.0 91.4  PLT 281 279 289   Basic Metabolic Panel: Recent Labs  Lab 03/02/20 1348 03/03/20 0452 03/04/20 0728  NA 135 136 138  K 4.0 3.8 3.9  CL 100 100 103  CO2 21* 28 25  GLUCOSE 199* 179* 134*  BUN 16 15 16   CREATININE 1.11 1.09 1.04  CALCIUM 9.5 9.0 8.9   GFR: Estimated Creatinine Clearance: 75.7 mL/min (by C-G formula based on SCr of 1.04 mg/dL). Liver Function Tests: Recent Labs  Lab 03/02/20 1348  AST 19  ALT 20  ALKPHOS 83  BILITOT 1.3*  PROT 7.8  ALBUMIN 3.8   No results for input(s): LIPASE, AMYLASE in the last 168 hours. No results for input(s): AMMONIA in the last 168 hours. Coagulation Profile: No results for input(s): INR, PROTIME in the last 168 hours. Cardiac Enzymes: No results for input(s): CKTOTAL, CKMB, CKMBINDEX, TROPONINI in the last 168 hours. BNP (last 3 results) No results for input(s): PROBNP in the last 8760 hours. HbA1C: No results for input(s): HGBA1C in the last 72 hours. CBG: No results for input(s): GLUCAP in the last 168 hours. Lipid Profile: No results for input(s): CHOL, HDL, LDLCALC, TRIG, CHOLHDL, LDLDIRECT in the last 72 hours. Thyroid Function Tests: Recent Labs    03/03/20 0452  TSH 1.449   Anemia Panel: No results for input(s): VITAMINB12, FOLATE, FERRITIN, TIBC, IRON, RETICCTPCT in the last 72 hours. Sepsis Labs: Recent Labs  Lab 03/02/20 1627 03/02/20 1817  LATICACIDVEN 1.5 1.2    Recent  Results (from the past 240 hour(s))  Urine culture     Status: Abnormal (Preliminary result)   Collection Time: 03/02/20  1:48 PM   Specimen: Urine, Random  Result Value Ref Range Status   Specimen Description   Final    URINE, RANDOM Performed at [facility], [address] [place], [place]    Special Requests   Final    NONE Performed at [facility], [address], [place], [place]    Culture (A)  Final    >=100,000 COLONIES/mL ESCHERICHIA COLI SUSCEPTIBILITIES TO FOLLOW Performed at [facility], [address] [place], [place]    Report Status PENDING  Incomplete  Respiratory Panel by RT PCR (Flu A&B, Covid) - Nasopharyngeal Swab     Status: None   Collection Time: 03/02/20  4:22 PM   Specimen: Nasopharyngeal Swab  Result Value Ref Range Status   SARS Coronavirus 2 by RT PCR NEGATIVE NEGATIVE Final    Comment: (NOTE) SARS-CoV-2 target nucleic acids are NOT DETECTED.  The SARS-CoV-2 RNA is generally detectable in upper respiratoy specimens during the acute phase of  infection. The lowest concentration of SARS-CoV-2 viral copies this assay can detect is 131 copies/mL. A negative result does not preclude SARS-Cov-2 infection and should not be used as the sole basis for treatment or other patient management decisions. A negative result may occur with  improper specimen collection/handling, submission of specimen other than nasopharyngeal swab, presence of viral mutation(s) within the areas targeted by this assay, and inadequate number of viral copies (<131 copies/mL). A negative result must be combined with clinical observations, patient history, and epidemiological information. The expected result is Negative.  Fact Sheet for Patients:  https://www.moore.com/  Fact Sheet for Healthcare Providers:  https://www.young.biz/  This test is no t yet approved or cleared by the  [place] FDA and  has been authorized for detection and/or diagnosis of SARS-CoV-2 by FDA under an Emergency Use Authorization (EUA). This EUA will remain  in effect (meaning this test can be used) for the duration of the COVID-19 declaration under Section 564(b)(1) of the Act, 21 U.S.C. section 360bbb-3(b)(1), unless the authorization is terminated or revoked sooner.     Influenza A by PCR NEGATIVE NEGATIVE Final   Influenza B by PCR NEGATIVE NEGATIVE Final    Comment: (NOTE) The Xpert Xpress SARS-CoV-2/FLU/RSV assay is intended as an aid in  the diagnosis of influenza from Nasopharyngeal swab specimens and  should not be used as a sole basis for treatment. Nasal washings and  aspirates are unacceptable for Xpert Xpress SARS-CoV-2/FLU/RSV  testing.  Fact Sheet for Patients: https://www.moore.com/  Fact Sheet for Healthcare Providers: https://www.young.biz/  This test is not yet approved or cleared by the [place] FDA and  has been authorized for detection and/or diagnosis of SARS-CoV-2 by  FDA under an Emergency Use Authorization (EUA). This EUA will remain  in effect (meaning this test can be used) for the duration of the  Covid-19 declaration under Section 564(b)(1) of the Act, 21  U.S.C. section 360bbb-3(b)(1), unless the authorization is  terminated or revoked. Performed at [facility], [address] [place], [place] [address]   Blood culture (routine x 2)     Status: None (Preliminary result)   Collection Time: 03/02/20  4:27 PM   Specimen: BLOOD  Result Value Ref Range Status   Specimen Description BLOOD LAC  Final   Special Requests   Final    BOTTLES DRAWN AEROBIC AND ANAEROBIC Blood Culture adequate volume   Culture   Final    NO GROWTH 2 DAYS Performed at [facility], [address], [place], [place] [address]    Report Status PENDING  Incomplete  Blood culture (routine x 2)     Status:  None (Preliminary result)   Collection Time: 03/02/20  4:27 PM   Specimen: BLOOD  Result Value Ref Range Status   Specimen Description BLOOD RAC  Final   Special Requests   Final    BOTTLES DRAWN AEROBIC AND ANAEROBIC Blood Culture adequate volume   Culture   Final    NO GROWTH 2 DAYS Performed at [facility], [address] [place], [place] [address]    Report Status PENDING  Incomplete  Chlamydia/NGC rt PCR ([facility] only)     Status: None   Collection Time: 03/03/20  4:52 AM   Specimen: Urine  Result Value Ref Range Status   Specimen source GC/Chlam URINE, RANDOM  Final   Chlamydia Tr NOT DETECTED NOT DETECTED Final   N gonorrhoeae NOT DETECTED NOT DETECTED Final    Comment: (NOTE) This  CT/NG assay has not been evaluated in patients with a history of  hysterectomy. Performed at [facility], [address] [place], [place] [address]   MRSA PCR Screening     Status: None   Collection Time: 03/03/20  4:52 AM   Specimen: Nasal Mucosa; Nasopharyngeal  Result Value Ref Range Status   MRSA by PCR NEGATIVE NEGATIVE Final    Comment:        The GeneXpert MRSA Assay (FDA approved for NASAL specimens only), is one component of a comprehensive MRSA colonization surveillance program. It is not intended to diagnose MRSA infection nor to guide or monitor treatment for MRSA infections. Performed at [facility], [address], [place], [place] [address]          Radiology Studies: DG Chest [place] 1 View  Result Date: 03/02/2020 CLINICAL DATA:  69 year old male with history of cough and shortness of breath. EXAM: PORTABLE CHEST 1 VIEW COMPARISON:  Chest x-ray 12/12/2019. FINDINGS: Lung volumes are normal. No consolidative airspace disease. No pleural effusions. No pneumothorax. No pulmonary nodule or mass noted. Pulmonary vasculature and the cardiomediastinal silhouette are within normal limits. IMPRESSION: No radiographic evidence of acute  cardiopulmonary disease. Electronically Signed   By: Trudie Reed M.D.   On: 03/02/2020 18:07   CT Renal Stone Study  Result Date: 03/02/2020 CLINICAL DATA:  UTI, LEFT side lower back pain radiating around to side and to groin, BILATERAL testicular swelling, history Crohn's disease EXAM: CT ABDOMEN AND PELVIS WITHOUT CONTRAST TECHNIQUE: Multidetector CT imaging of the abdomen and pelvis was performed following the standard protocol without IV contrast. Sagittal and coronal MPR images reconstructed from axial data set. No oral contrast administered COMPARISON:  None FINDINGS: Lower chest: Minimal atelectasis or scarring at lung bases Hepatobiliary: Gallbladder and liver normal appearance Pancreas: Normal appearance Spleen: Normal appearance Adrenals/Urinary Tract: Adrenal glands normal appearance. Kidneys, ureters, and bladder normal appearance. No urinary tract calcification or dilatation. No renal masses. Stomach/Bowel: Appendix not visualized, no pericecal inflammatory process seen. Stomach decompressed. Mild sigmoid diverticulosis without evidence of diverticulitis. Remaining bowel loops unremarkable. Vascular/Lymphatic: Few pelvic phleboliths. Atherosclerotic calcifications aorta and iliac arteries without aneurysm. No adenopathy. Reproductive: Mild prostatic enlargement, gland measuring 5.0 x 3.8 cm. Other: Small LEFT inguinal hernia containing fat. Mild edema along the RIGHT spermatic cord without hernia, nonspecific. No free air or free fluid. Small umbilical hernia containing fat. Musculoskeletal: Degenerative disc and facet disease changes lumbar spine. Grade 1 anterolisthesis L4-L5. Mild degenerative changes of the hip joints. IMPRESSION: Mild prostatic enlargement. Small LEFT inguinal and umbilical hernias containing fat. Mild sigmoid diverticulosis without evidence of diverticulitis. Mild nonspecific edema is seen along the RIGHT spermatic cord through the RIGHT inguinal canal, nonspecific; this  could reflect inflammatory process but also could be seen with testicular torsion; recommend correlation with physical exam and if clinically indicated scrotal ultrasound with scrotal Doppler. Aortic Atherosclerosis (ICD10-I70.0). Electronically Signed   By: Ulyses Southward M.D.   On: 03/02/2020 17:51   US SCROTUM W/DOPPLER  Result Date: 03/02/2020 CLINICAL DATA:  Testicular pain and swelling for 4 days EXAM: SCROTAL ULTRASOUND DOPPLER ULTRASOUND OF THE TESTICLES TECHNIQUE: Complete ultrasound examination of the testicles, epididymis, and other scrotal structures was performed. Color and spectral Doppler ultrasound were also utilized to evaluate blood flow to the testicles. COMPARISON:  01/30/2020 FINDINGS: Right testicle Measurements: 4.4 x 3.1 by 3.4 cm. No mass or microlithiasis visualized. Left testicle Measurements: 3.9 x 2.0 by 2.6 cm. Echotexture is grossly normal. The  hypoechoic region seen within the left testicle and prior study has nearly completely resolved, now measuring only 2 x 2 x 1 mm, likely sequela of previous inflammation/infection. Right epididymis: Mildly enlarged compared to the left, measuring 14 x 8 by 14 mm. Increased vascularity. Left epididymis:  Normal in size and appearance. Hydrocele: Complex right hydrocele is identified with numerous septations. Varicocele:  None visualized. Pulsed Doppler interrogation of both testes demonstrates normal low resistance arterial and venous waveforms bilaterally. The right testicle and epididymis are hypervascular compared to the left, likely due to underlying infection. IMPRESSION: 1. Hypervascular right testicle and epididymis, consistent with epididymo-orchitis. No evidence of intra testicular abscess. 2. Complex right hydrocele, with multiple septations noted. 3. Postinflammatory/postinfectious change within the left testicle, with no residual fluid collection compared to prior study. Electronically Signed   By: Sharlet Salina M.D.   On: 03/02/2020  17:27        Scheduled Meds: . enoxaparin (LOVENOX) injection  40 mg Subcutaneous Q24H  . ketorolac  15 mg Intravenous Q6H  . metoprolol succinate  25 mg Oral Daily  . rosuvastatin  10 mg Oral Daily  . spironolactone  25 mg Oral Daily   Continuous Infusions: . cefTRIAXone (ROCEPHIN)  IV 1 g (03/03/20 1725)     LOS: 1 day    Time spent: 25 minutes    Tresa Moore, MD [facility] Pager [phone]  If 7PM-7AM, please contact night-coverage 03/04/2020, 10:51 AM

## 2020-03-05 DIAGNOSIS — N453 Epididymo-orchitis: Secondary | ICD-10-CM | POA: Diagnosis not present

## 2020-03-05 DIAGNOSIS — I10 Essential (primary) hypertension: Secondary | ICD-10-CM | POA: Diagnosis not present

## 2020-03-05 DIAGNOSIS — I5032 Chronic diastolic (congestive) heart failure: Secondary | ICD-10-CM | POA: Diagnosis not present

## 2020-03-05 DIAGNOSIS — N12 Tubulo-interstitial nephritis, not specified as acute or chronic: Secondary | ICD-10-CM | POA: Diagnosis not present

## 2020-03-05 LAB — CBC WITH DIFFERENTIAL/PLATELET
Abs Immature Granulocytes: 0.09 10*3/uL — ABNORMAL HIGH (ref 0.00–0.07)
Basophils Absolute: 0.1 10*3/uL (ref 0.0–0.1)
Basophils Relative: 1 %
Eosinophils Absolute: 0.3 10*3/uL (ref 0.0–0.5)
Eosinophils Relative: 3 %
HCT: 35.3 % — ABNORMAL LOW (ref 39.0–52.0)
Hemoglobin: 12.3 g/dL — ABNORMAL LOW (ref 13.0–17.0)
Immature Granulocytes: 1 %
Lymphocytes Relative: 16 %
Lymphs Abs: 1.7 10*3/uL (ref 0.7–4.0)
MCH: 31.5 pg (ref 26.0–34.0)
MCHC: 34.8 g/dL (ref 30.0–36.0)
MCV: 90.5 fL (ref 80.0–100.0)
Monocytes Absolute: 0.7 10*3/uL (ref 0.1–1.0)
Monocytes Relative: 6 %
Neutro Abs: 7.9 10*3/uL — ABNORMAL HIGH (ref 1.7–7.7)
Neutrophils Relative %: 73 %
Platelets: 328 10*3/uL (ref 150–400)
RBC: 3.9 MIL/uL — ABNORMAL LOW (ref 4.22–5.81)
RDW: 13.2 % (ref 11.5–15.5)
WBC: 10.7 10*3/uL — ABNORMAL HIGH (ref 4.0–10.5)
nRBC: 0 % (ref 0.0–0.2)

## 2020-03-05 LAB — BASIC METABOLIC PANEL
Anion gap: 9 (ref 5–15)
BUN: 15 mg/dL (ref 8–23)
CO2: 27 mmol/L (ref 22–32)
Calcium: 9.2 mg/dL (ref 8.9–10.3)
Chloride: 106 mmol/L (ref 98–111)
Creatinine, Ser: 0.86 mg/dL (ref 0.61–1.24)
GFR, Estimated: >60 mL/min (ref >60)
Glucose, Bld: 130 mg/dL — ABNORMAL HIGH (ref 70–99)
Potassium: 4.3 mmol/L (ref 3.5–5.1)
Sodium: 142 mmol/L (ref 135–145)

## 2020-03-05 LAB — URINE CULTURE: Culture: >=100000 — AB

## 2020-03-05 NOTE — Consult Note (Signed)
Urology Consult  I have been asked to see the patient by Dr. Marylu Lund, for evaluation and management of right orchitis/UTI.  Chief Complaint: Right scrotal swelling  History of Present Illness: Douglas Humphrey is a 69 y.o. year old male with a personal history of recent UTI/orchitis followed by Dr. Legrand Rams who presented to the emergency room Sunday with several days of urinary symptoms including dysuria, urgency frequency and right testicular swelling.  He underwent imaging including a scrotal ultrasound which indicated findings consistent with right orchitis.  His urinalysis at the time of admission was grossly positive and he ended up growing pansensitive E. coli.  He has been on ceftriaxone during this admission with improving testicular exam and improvement in his urinary symptoms.  His leukocytosis is also improving.  Urology was asked to see the patient to provide further recommendations.  Past Medical History:  Diagnosis Date  . Chronic back pain   . Crohn's disease (HCC)   . Leaky heart valve     Past Surgical History:  Procedure Laterality Date  . NO PAST SURGERIES      Home Medications:  Current Meds  Medication Sig  . glucosamine-chondroitin 500-400 MG tablet Take 1 tablet by mouth at bedtime.  . Guaifenesin 1200 MG TB12 Take 1,200 mg by mouth at bedtime as needed (for cough).  . metoprolol succinate (TOPROL-XL) 25 MG 24 hr tablet Take 25 mg by mouth daily.  . Multiple Vitamin (MULTIVITAMIN WITH MINERALS) TABS tablet Take 1 tablet by mouth at bedtime.  . polycarbophil (FIBERCON) 625 MG tablet Take 625 mg by mouth at bedtime.  . rosuvastatin (CRESTOR) 10 MG tablet Take 10 mg by mouth daily.  Marland Kitchen spironolactone (ALDACTONE) 25 MG tablet Take 25 mg by mouth daily.    Allergies: No Known Allergies  Family History  Problem Relation Age of Onset  . CAD Mother   . COPD Mother   . CAD Father     Social History:  reports that he has never smoked. He has never  used smokeless tobacco. He reports that he does not drink alcohol and does not use drugs.  ROS: A complete review of systems was performed.  All systems are negative except for pertinent findings as noted.  Physical Exam:  Vital signs in last 24 hours: Temp:  [97.8 F (36.6 C)-98.8 F (37.1 C)] 98.3 F (36.8 C) (11/10 0735) Pulse Rate:  [73-84] 82 (11/10 1123) Resp:  [16-18] 18 (11/10 1123) BP: (105-134)/(64-87) 134/87 (11/10 1123) SpO2:  [96 %-99 %] 97 % (11/10 1123) Constitutional:  Alert and oriented, No acute distress HEENT: [place] AT, moist mucus membranes.  Trachea midline, no masses Cardiovascular: Regular rate and rhythm, no clubbing, cyanosis, or edema. Respiratory: Normal respiratory effort, lungs clear bilaterally GI: Abdomen is soft, nontender, nondistended, no abdominal masses GU: Normal phallus.  Left testicle normal.  Right testicle enlarged/ firm with some subtle overlying skin changes, minimally tender. Skin: No rashes, bruises or suspicious lesions Neurologic: Grossly intact, no focal deficits, moving all 4 extremities Psychiatric: Normal mood and affect   Laboratory Data:  Recent Labs    03/03/20 0452 03/04/20 0728 03/05/20 0443  WBC 18.7* 14.5* 10.7*  HGB 12.2* 12.0* 12.3*  HCT 34.7* 34.1* 35.3*   Recent Labs    03/03/20 0452 03/04/20 0728 03/05/20 0443  NA 136 138 142  K 3.8 3.9 4.3  CL 100 103 106  CO2 GLUCOSE 179* 134* 130*  BUN 15 16 15  CREATININE 1.09 1.04 0.86  CALCIUM 9.0 8.9 9.2   No results for input(s): LABPT, INR in the last 72 hours. No results for input(s): LABURIN in the last 72 hours. Results for orders placed or performed during the hospital encounter of 03/02/20  Urine culture     Status: Abnormal   Collection Time: 03/02/20  1:48 PM   Specimen: Urine, Random  Result Value Ref Range Status   Specimen Description   Final    URINE, RANDOM Performed at [facility], [address] [place], [place]  [address]    Special Requests   Final    NONE Performed at [facility], [address] [place], [place] [address]    Culture >=100,000 COLONIES/mL ESCHERICHIA COLI (A)  Final   Report Status 03/05/2020 FINAL  Final   Organism ID, Bacteria ESCHERICHIA COLI (A)  Final      Susceptibility   Escherichia coli - MIC*    AMPICILLIN <=2 SENSITIVE Sensitive     CEFAZOLIN <=4 SENSITIVE Sensitive     CEFEPIME <=0.12 SENSITIVE Sensitive     CEFTRIAXONE <=0.25 SENSITIVE Sensitive     CIPROFLOXACIN <=0.25 SENSITIVE Sensitive     GENTAMICIN <=1 SENSITIVE Sensitive     IMIPENEM <=0.25 SENSITIVE Sensitive     NITROFURANTOIN <=16 SENSITIVE Sensitive     TRIMETH/SULFA <=20 SENSITIVE Sensitive     AMPICILLIN/SULBACTAM <=2 SENSITIVE Sensitive     PIP/TAZO <=4 SENSITIVE Sensitive     * >=100,000 COLONIES/mL ESCHERICHIA COLI  Respiratory Panel by RT PCR (Flu A&B, Covid) - Nasopharyngeal Swab     Status: None   Collection Time: 03/02/20  4:22 PM   Specimen: Nasopharyngeal Swab  Result Value Ref Range Status   SARS Coronavirus 2 by RT PCR NEGATIVE NEGATIVE Final    Comment: (NOTE) SARS-CoV-2 target nucleic acids are NOT DETECTED.  The SARS-CoV-2 RNA is generally detectable in upper respiratoy specimens during the acute phase of infection. The lowest concentration of SARS-CoV-2 viral copies this assay can detect is 131 copies/mL. A negative result does not preclude SARS-Cov-2 infection and should not be used as the sole basis for treatment or other patient management decisions. A negative result may occur with  improper specimen collection/handling, submission of specimen other than nasopharyngeal swab, presence of viral mutation(s) within the areas targeted by this assay, and inadequate number of viral copies (<131 copies/mL). A negative result must be combined with clinical observations, patient history, and epidemiological information. The expected result is Negative.  Fact Sheet for  Patients:  https://www.moore.com/  Fact Sheet for Healthcare Providers:  https://www.young.biz/  This test is no t yet approved or cleared by the [place] FDA and  has been authorized for detection and/or diagnosis of SARS-CoV-2 by FDA under an Emergency Use Authorization (EUA). This EUA will remain  in effect (meaning this test can be used) for the duration of the COVID-19 declaration under Section 564(b)(1) of the Act, 21 U.S.C. section 360bbb-3(b)(1), unless the authorization is terminated or revoked sooner.     Influenza A by PCR NEGATIVE NEGATIVE Final   Influenza B by PCR NEGATIVE NEGATIVE Final    Comment: (NOTE) The Xpert Xpress SARS-CoV-2/FLU/RSV assay is intended as an aid in  the diagnosis of influenza from Nasopharyngeal swab specimens and  should not be used as a sole basis for treatment. Nasal washings and  aspirates are unacceptable for Xpert Xpress SARS-CoV-2/FLU/RSV  testing.  Fact Sheet for Patients: https://www.moore.com/  Fact Sheet for Healthcare Providers: https://www.young.biz/  This test is not yet approved or cleared by the [place] and  has been authorized for detection and/or diagnosis of SARS-CoV-2 by  FDA under an Emergency Use Authorization (EUA). This EUA will remain  in effect (meaning this test can be used) for the duration of the  Covid-19 declaration under Section 564(b)(1) of the Act, 21  U.S.C. section 360bbb-3(b)(1), unless the authorization is  terminated or revoked. Performed at [facility], [address] [place], [place] [address]   Blood culture (routine x 2)     Status: None (Preliminary result)   Collection Time: 03/02/20  4:27 PM   Specimen: BLOOD  Result Value Ref Range Status   Specimen Description BLOOD LAC  Final   Special Requests   Final    BOTTLES DRAWN AEROBIC AND ANAEROBIC Blood Culture adequate volume   Culture    Final    NO GROWTH 3 DAYS Performed at [facility], [address] [place], [place] [address]    Report Status PENDING  Incomplete  Blood culture (routine x 2)     Status: None (Preliminary result)   Collection Time: 03/02/20  4:27 PM   Specimen: BLOOD  Result Value Ref Range Status   Specimen Description BLOOD RAC  Final   Special Requests   Final    BOTTLES DRAWN AEROBIC AND ANAEROBIC Blood Culture adequate volume   Culture   Final    NO GROWTH 3 DAYS Performed at [facility], [address], [place], [place] [address]    Report Status PENDING  Incomplete  Chlamydia/NGC rt PCR ([facility] only)     Status: None   Collection Time: 03/03/20  4:52 AM   Specimen: Urine  Result Value Ref Range Status   Specimen source GC/Chlam URINE, RANDOM  Final   Chlamydia Tr NOT DETECTED NOT DETECTED Final   N gonorrhoeae NOT DETECTED NOT DETECTED Final    Comment: (NOTE) This CT/NG assay has not been evaluated in patients with a history of  hysterectomy. Performed at [facility], [address] [place], [place] [address]   MRSA PCR Screening     Status: None   Collection Time: 03/03/20  4:52 AM   Specimen: Nasal Mucosa; Nasopharyngeal  Result Value Ref Range Status   MRSA by PCR NEGATIVE NEGATIVE Final    Comment:        The GeneXpert MRSA Assay (FDA approved for NASAL specimens only), is one component of a comprehensive MRSA colonization surveillance program. It is not intended to diagnose MRSA infection nor to guide or monitor treatment for MRSA infections. Performed at [facility], [address] [place], [place] [address]      Radiologic Imaging: CLINICAL DATA:  Testicular pain and swelling for 4 days  EXAM: SCROTAL ULTRASOUND  DOPPLER ULTRASOUND OF THE TESTICLES  TECHNIQUE: Complete ultrasound examination of the testicles, epididymis, and other scrotal structures was performed. Color and spectral Doppler ultrasound were  also utilized to evaluate blood flow to the testicles.  COMPARISON:  01/30/2020  FINDINGS: Right testicle  Measurements: 4.4 x 3.1 by 3.4 cm. No mass or microlithiasis visualized.  Left testicle  Measurements: 3.9 x 2.0 by 2.6 cm. Echotexture is grossly normal. The hypoechoic region seen within the left testicle and prior study has nearly completely resolved, now measuring only 2 x 2 x 1 mm, likely sequela of previous inflammation/infection.  Right epididymis: Mildly enlarged compared to the left, measuring 14 x 8 by 14 mm. Increased vascularity.  Left epididymis:  Normal in size and appearance.  Hydrocele: Complex right hydrocele is identified with numerous septations.  Varicocele:  None visualized.  Pulsed Doppler interrogation of both testes demonstrates normal low resistance arterial and venous waveforms bilaterally. The right testicle and epididymis are hypervascular compared to the left, likely due to underlying infection.  IMPRESSION: 1. Hypervascular right testicle and epididymis, consistent with epididymo-orchitis. No evidence of intra testicular abscess. 2. Complex right hydrocele, with multiple septations noted. 3. Postinflammatory/postinfectious change within the left testicle, with no residual fluid collection compared to prior study.   Electronically Signed   By: Sharlet Salina M.D.   On: 03/02/2020 17:27  Scrotal ultrasound was personally reviewed.  Agree with radiologic interpretation.  Impression/Assessment:  69 year old male with right epididymoorchitis/UTI who is improving on ceftriaxone.  Plan:  -Urine culture reviewed, grew pansensitive E. coli.  Given that he had a fairly quick recurrence of UTI/epididymitis/orchitis shortly following course of doxycycline, would recommend trying a different antibiotic with good tissue penetration upon discharge.  Would recommend Cipro 500 mg twice daily for 14 days with reassessment in the clinic  whether not to extend this course.  -Scrotal support, jockstrap when ambulating  -Mr. Bilal already has follow-up scheduled with Dr. Richardo Hanks.  I asked him to not repeat his scrotal ultrasound prior to follow-up as previously recommended in light of this recent infection.  -Given that he had multiple UTIs and epididymoorchitis bilaterally in the recent past, he may benefit from further urologic evaluation including possibly cystoscopy to rule out underlying contributing factors  03/05/2020, 12:58 PM  Vanna Scotland,  MD  Urology will sign off.  Appropriate for discharge from urologic perspective.  Outpatient follow-up has already been arranged.  Please contact us with any questions or concerns.  Recommendations were directly communicated to Dr. Marylu Lund.

## 2020-03-05 NOTE — Progress Notes (Signed)
PROGRESS NOTE    Douglas Humphrey  IHK:[id number] DOB: 01/18/1951 DOA: 03/02/2020 PCP: [name], MD    Brief Narrative:  Douglas Humphrey a 69 y.o.malewith medical history significant forhypertension, grade 1 diastolic dysfunction, (echo in 2017), history of left orchitis, presents to the emergency department for chief concerns ofRIGHTscrotal pain.  11/10- consulted urology. Feels litter better, but still swollen and with some pain    Consultants:   urology  Procedures:   renal CT Mild prostatic enlargement. Small LEFT inguinal and umbilical hernias containing fat. Mild sigmoid diverticulosis without evidence of diverticulitis. Mild nonspecific edema is seen along the RIGHT spermatic cord through the RIGHT inguinal canal, nonspecific; this could reflect inflammatory process but also could be seen with testicular torsion; recommend correlation with physical exam and if clinically indicated scrotal ultrasound with scrotal Doppler. Aortic Atherosclerosis (ICD10-I70.0).   Scrotal US . Hypervascular right testicle and epididymis, consistent with epididymo-orchitis. No evidence of intra testicular abscess. 2. Complex right hydrocele, with multiple septations noted. 3. Postinflammatory/postinfectious change within the left testicle, with no residual fluid collection compared to prior study.   Antimicrobials:    ceftriaxone    Subjective: Still with swelling.  Pain is a little better.  Overall starting to feel better urinating okay  Objective: Vitals:   03/04/20 2324 03/04/20 2326 03/05/20 0430 03/05/20 0735  BP:  113/68 105/64 113/86  Pulse: 84 83 73 77  Resp: 17 17 17 16   Temp: 98.5 F (36.9 C) 98.8 F (37.1 C) 97.8 F (36.6 C) 98.3 F (36.8 C)  TempSrc: Oral Oral  Oral  SpO2: 99% 99% 96% 97%  Weight:      Height:        Intake/Output Summary (Last 24 hours) at 03/05/2020 0847 Last data filed at 03/05/2020 13/01/2020 Gross per 24 hour  Intake 720 ml   Output 1750 ml  Net -1030 ml   Filed Weights   03/02/20 1345  Weight: 90.7 kg    Examination:  General exam: Appears calm and comfortable  Respiratory system: Clear to auscultation. Respiratory effort normal. Cardiovascular system: S1 & S2 heard, RRR. No JVD, murmurs, rubs, gallops or clicks. No pedal edema. Gastrointestinal system: Abdomen is nondistended, soft and nontender.  Normal bowel sounds heard. GU: scrotum swollen, mildly pinkish. Central nervous system: Alert and oriented. No focal neurological deficits. Extremities: no edema Skin: warm, dry Psychiatry: Judgement and insight appear normal. Mood & affect appropriate.     Data Reviewed: I have personally reviewed following labs and imaging studies  CBC: Recent Labs  Lab 03/02/20 1348 03/03/20 0452 03/04/20 0728 03/05/20 0443  WBC 21.2* 18.7* 14.5* 10.7*  NEUTROABS  --   --  11.8* 7.9*  HGB 13.8 12.2* 12.0* 12.3*  HCT 39.2 34.7* 34.1* 35.3*  MCV 90.3 92.0 91.4 90.5  PLT 281 279 289 328   Basic Metabolic Panel: Recent Labs  Lab 03/02/20 1348 03/03/20 0452 03/04/20 0728 03/05/20 0443  NA 135 136 138 142  K 4.0 3.8 3.9 4.3  CL 100 100 103 106  CO2 21* 28 25 27   GLUCOSE 199* 179* 134* 130*  BUN 16 15 16 15   CREATININE 1.11 1.09 1.04 0.86  CALCIUM 9.5 9.0 8.9 9.2   GFR: Estimated Creatinine Clearance: 91.5 mL/min (by C-G formula based on SCr of 0.86 mg/dL). Liver Function Tests: Recent Labs  Lab 03/02/20 1348  AST 19  ALT 20  ALKPHOS 83  BILITOT 1.3*  PROT 7.8  ALBUMIN 3.8   No results for input(s):  LIPASE, AMYLASE in the last 168 hours. No results for input(s): AMMONIA in the last 168 hours. Coagulation Profile: No results for input(s): INR, PROTIME in the last 168 hours. Cardiac Enzymes: No results for input(s): CKTOTAL, CKMB, CKMBINDEX, TROPONINI in the last 168 hours. BNP (last 3 results) No results for input(s): PROBNP in the last 8760 hours. HbA1C: No results for input(s): HGBA1C  in the last 72 hours. CBG: No results for input(s): GLUCAP in the last 168 hours. Lipid Profile: No results for input(s): CHOL, HDL, LDLCALC, TRIG, CHOLHDL, LDLDIRECT in the last 72 hours. Thyroid Function Tests: Recent Labs    03/03/20 0452  TSH 1.449   Anemia Panel: No results for input(s): VITAMINB12, FOLATE, FERRITIN, TIBC, IRON, RETICCTPCT in the last 72 hours. Sepsis Labs: Recent Labs  Lab 03/02/20 1627 03/02/20 1817  LATICACIDVEN 1.5 1.2    Recent Results (from the past 240 hour(s))  Urine culture     Status: Abnormal   Collection Time: 03/02/20  1:48 PM   Specimen: Urine, Random  Result Value Ref Range Status   Specimen Description   Final    URINE, RANDOM Performed at [facility], [address], [place], [place] [address]    Special Requests   Final    NONE Performed at [facility], [address] [place], [place] [address]    Culture >=100,000 COLONIES/mL ESCHERICHIA COLI (A)  Final   Report Status 03/05/2020 FINAL  Final   Organism ID, Bacteria ESCHERICHIA COLI (A)  Final      Susceptibility   Escherichia coli - MIC*    AMPICILLIN <=2 SENSITIVE Sensitive     CEFAZOLIN <=4 SENSITIVE Sensitive     CEFEPIME <=0.12 SENSITIVE Sensitive     CEFTRIAXONE <=0.25 SENSITIVE Sensitive     CIPROFLOXACIN <=0.25 SENSITIVE Sensitive     GENTAMICIN <=1 SENSITIVE Sensitive     IMIPENEM <=0.25 SENSITIVE Sensitive     NITROFURANTOIN <=16 SENSITIVE Sensitive     TRIMETH/SULFA <=20 SENSITIVE Sensitive     AMPICILLIN/SULBACTAM <=2 SENSITIVE Sensitive     PIP/TAZO <=4 SENSITIVE Sensitive     * >=100,000 COLONIES/mL ESCHERICHIA COLI  Respiratory Panel by RT PCR (Flu A&B, Covid) - Nasopharyngeal Swab     Status: None   Collection Time: 03/02/20  4:22 PM   Specimen: Nasopharyngeal Swab  Result Value Ref Range Status   SARS Coronavirus 2 by RT PCR NEGATIVE NEGATIVE Final    Comment: (NOTE) SARS-CoV-2 target nucleic acids are NOT DETECTED.  The  SARS-CoV-2 RNA is generally detectable in upper respiratoy specimens during the acute phase of infection. The lowest concentration of SARS-CoV-2 viral copies this assay can detect is 131 copies/mL. A negative result does not preclude SARS-Cov-2 infection and should not be used as the sole basis for treatment or other patient management decisions. A negative result may occur with  improper specimen collection/handling, submission of specimen other than nasopharyngeal swab, presence of viral mutation(s) within the areas targeted by this assay, and inadequate number of viral copies (<131 copies/mL). A negative result must be combined with clinical observations, patient history, and epidemiological information. The expected result is Negative.  Fact Sheet for Patients:  https://www.moore.com/  Fact Sheet for Healthcare Providers:  https://www.young.biz/  This test is no t yet approved or cleared by the [place] FDA and  has been authorized for detection and/or diagnosis of SARS-CoV-2 by FDA under an Emergency Use Authorization (EUA). This EUA will remain  in effect (meaning this test can be  used) for the duration of the COVID-19 declaration under Section 564(b)(1) of the Act, 21 U.S.C. section 360bbb-3(b)(1), unless the authorization is terminated or revoked sooner.     Influenza A by PCR NEGATIVE NEGATIVE Final   Influenza B by PCR NEGATIVE NEGATIVE Final    Comment: (NOTE) The Xpert Xpress SARS-CoV-2/FLU/RSV assay is intended as an aid in  the diagnosis of influenza from Nasopharyngeal swab specimens and  should not be used as a sole basis for treatment. Nasal washings and  aspirates are unacceptable for Xpert Xpress SARS-CoV-2/FLU/RSV  testing.  Fact Sheet for Patients: https://www.moore.com/  Fact Sheet for Healthcare Providers: https://www.young.biz/  This test is not yet approved or cleared  by the [place] FDA and  has been authorized for detection and/or diagnosis of SARS-CoV-2 by  FDA under an Emergency Use Authorization (EUA). This EUA will remain  in effect (meaning this test can be used) for the duration of the  Covid-19 declaration under Section 564(b)(1) of the Act, 21  U.S.C. section 360bbb-3(b)(1), unless the authorization is  terminated or revoked. Performed at [facility], [address] [place], [place] [address]   Blood culture (routine x 2)     Status: None (Preliminary result)   Collection Time: 03/02/20  4:27 PM   Specimen: BLOOD  Result Value Ref Range Status   Specimen Description BLOOD LAC  Final   Special Requests   Final    BOTTLES DRAWN AEROBIC AND ANAEROBIC Blood Culture adequate volume   Culture   Final    NO GROWTH 3 DAYS Performed at [facility], [address] [place], [place] [address]    Report Status PENDING  Incomplete  Blood culture (routine x 2)     Status: None (Preliminary result)   Collection Time: 03/02/20  4:27 PM   Specimen: BLOOD  Result Value Ref Range Status   Specimen Description BLOOD RAC  Final   Special Requests   Final    BOTTLES DRAWN AEROBIC AND ANAEROBIC Blood Culture adequate volume   Culture   Final    NO GROWTH 3 DAYS Performed at [facility], [address], [place], [place] [address]    Report Status PENDING  Incomplete  Chlamydia/NGC rt PCR ([facility] only)     Status: None   Collection Time: 03/03/20  4:52 AM   Specimen: Urine  Result Value Ref Range Status   Specimen source GC/Chlam URINE, RANDOM  Final   Chlamydia Tr NOT DETECTED NOT DETECTED Final   N gonorrhoeae NOT DETECTED NOT DETECTED Final    Comment: (NOTE) This CT/NG assay has not been evaluated in patients with a history of  hysterectomy. Performed at [facility], [address] [place], [place] [address]   MRSA PCR Screening     Status: None   Collection Time: 03/03/20  4:52 AM   Specimen:  Nasal Mucosa; Nasopharyngeal  Result Value Ref Range Status   MRSA by PCR NEGATIVE NEGATIVE Final    Comment:        The GeneXpert MRSA Assay (FDA approved for NASAL specimens only), is one component of a comprehensive MRSA colonization surveillance program. It is not intended to diagnose MRSA infection nor to guide or monitor treatment for MRSA infections. Performed at [facility], [address] [place], [place] [address]          Radiology Studies: No results found.      Scheduled Meds: . enoxaparin (LOVENOX) injection  40 mg Subcutaneous Q24H  .  ketorolac  15 mg Intravenous Q6H  . metoprolol succinate  25 mg Oral Daily  . rosuvastatin  10 mg Oral Daily  . spironolactone  25 mg Oral Daily   Continuous Infusions: . cefTRIAXone (ROCEPHIN)  IV 1 g (03/04/20 1736)    Assessment & Plan:   Active Problems:   Pyelonephritis   H/O orchitis   Cellulitis   Diastolic CHF, chronic (HCC)   Essential hypertension   Epididymoorchitis    Right epididymoorchitis/UTI Suspect infectious in origin Urine culture from 11/7 is E. coli more than 100,000 Still quite swollen with pain. Leukocytosis improving. Urology consulted today- recommend 500 mg twice daily for 14 days with reassessment in the clinic whether or not to extend this course. Asked nursing for scrotal support but they do not have any.  I asked him to keep scrotum elevated. Per urology given the multiple UTIs and epididymoorchitis b/l may benefit from cystoscopy to r/o underlying contributing factors.   Chronic diastolic congestive heart failure Noted on echocardiogram 2017 11/10-no acute exacerbation.  Euvolemic on exam Continue Aldactone and Toprol-XL   Hypertension Stable. Continue metoprolol and Aldactone   Hyperlipidemia Continue statin   DVT prophylaxis: Enoxaparin Code Status: Full Family Communication: None at bedside  Status is: Inpatient  Remains inpatient appropriate  because:IV treatments appropriate due to intensity of illness or inability to take PO   Dispo: The patient is from: Home              Anticipated d/c is to: Home              Anticipated d/c date is: 1 day              Patient currently is not medically stable to d/c. not stable for dc, needs ivabx , still very swollen            LOS: 2 days   Time spent: 35 min with >50% on coc    Lynn Ito, MD [facility] Pager [phone]  If 7PM-7AM, please contact night-coverage [email or web address] Password [facility] 03/05/2020, 8:47 AM

## 2020-03-06 DIAGNOSIS — N453 Epididymo-orchitis: Secondary | ICD-10-CM | POA: Diagnosis not present

## 2020-03-06 DIAGNOSIS — I10 Essential (primary) hypertension: Secondary | ICD-10-CM | POA: Diagnosis not present

## 2020-03-06 DIAGNOSIS — Z87438 Personal history of other diseases of male genital organs: Secondary | ICD-10-CM | POA: Diagnosis not present

## 2020-03-06 DIAGNOSIS — I5032 Chronic diastolic (congestive) heart failure: Secondary | ICD-10-CM | POA: Diagnosis not present

## 2020-03-06 LAB — BASIC METABOLIC PANEL
Anion gap: 9 (ref 5–15)
BUN: 17 mg/dL (ref 8–23)
CO2: 26 mmol/L (ref 22–32)
Calcium: 9.2 mg/dL (ref 8.9–10.3)
Chloride: 103 mmol/L (ref 98–111)
Creatinine, Ser: 0.92 mg/dL (ref 0.61–1.24)
GFR, Estimated: >60 mL/min (ref >60)
Glucose, Bld: 124 mg/dL — ABNORMAL HIGH (ref 70–99)
Potassium: 4.3 mmol/L (ref 3.5–5.1)
Sodium: 138 mmol/L (ref 135–145)

## 2020-03-06 LAB — CBC WITH DIFFERENTIAL/PLATELET
Abs Immature Granulocytes: 0.08 10*3/uL — ABNORMAL HIGH (ref 0.00–0.07)
Basophils Absolute: 0.1 10*3/uL (ref 0.0–0.1)
Basophils Relative: 1 %
Eosinophils Absolute: 0.4 10*3/uL (ref 0.0–0.5)
Eosinophils Relative: 4 %
HCT: 35.4 % — ABNORMAL LOW (ref 39.0–52.0)
Hemoglobin: 12.3 g/dL — ABNORMAL LOW (ref 13.0–17.0)
Immature Granulocytes: 1 %
Lymphocytes Relative: 17 %
Lymphs Abs: 1.6 10*3/uL (ref 0.7–4.0)
MCH: 31.5 pg (ref 26.0–34.0)
MCHC: 34.7 g/dL (ref 30.0–36.0)
MCV: 90.8 fL (ref 80.0–100.0)
Monocytes Absolute: 0.7 10*3/uL (ref 0.1–1.0)
Monocytes Relative: 7 %
Neutro Abs: 6.6 10*3/uL (ref 1.7–7.7)
Neutrophils Relative %: 70 %
Platelets: 358 10*3/uL (ref 150–400)
RBC: 3.9 MIL/uL — ABNORMAL LOW (ref 4.22–5.81)
RDW: 13.2 % (ref 11.5–15.5)
WBC: 9.4 10*3/uL (ref 4.0–10.5)
nRBC: 0 % (ref 0.0–0.2)

## 2020-03-06 MED ORDER — CIPROFLOXACIN HCL 500 MG PO TABS: 500.0000 mg | ORAL_TABLET | Freq: Two times a day (BID) | ORAL | 0 refills | Status: DC

## 2020-03-06 MED ORDER — CIPROFLOXACIN HCL 500 MG PO TABS: 500.0000 mg | ORAL_TABLET | Freq: Two times a day (BID) | ORAL | Status: DC

## 2020-03-06 MED ORDER — CIPROFLOXACIN HCL 500 MG PO TABS
500.0000 mg | ORAL_TABLET | Freq: Two times a day (BID) | ORAL | Status: DC
  Administered 2020-03-06: 500 mg via ORAL
  Filled 2020-03-06 (×3): qty 1

## 2020-03-06 MED ORDER — CIPROFLOXACIN HCL 500 MG PO TABS: 500.0000 mg | ORAL_TABLET | Freq: Two times a day (BID) | ORAL | 0 refills | Status: AC

## 2020-03-06 NOTE — Discharge Summary (Signed)
Douglas Humphrey ZOX:[id number] DOB: 11-29-1950 DOA: 03/02/2020  PCP: [name], MD  Admit date: 03/02/2020 Discharge date: 03/06/2020  Admitted From: home Disposition:  home  Recommendations for Outpatient Follow-up:  1. Follow up with PCP in 1 week 2. Please obtain BMP/CBC in one week 3. F/u with Urology Dr. Naomie Dean as scheduled      Discharge Condition:Stable CODE STATUS: Full Diet recommendation: Heart Healthy Brief/Interim Summary:  Douglas Humphrey is a 69 y.o. male with medical history significant for hypertension, grade 1 diastolic dysfunction, (echo in 2017), history of left orchitis, presents to the emergency department for chief concerns of RIGHT scrotal pain.  He had a renal CT and scrotal ultrasound as results stated below.  Urology was consulted.  UA was positive.  He was admitted to the hospital service for management of a epididymoorchitis and UTI  Right epididymoorchitis/UTI Suspect infectious in origin Urine culture from 11/7 is E. coli more than 100,000 Improving.  Leukocytosis resolved remains afebrile  Urology was consulted- recommend 500 mg twice daily for 14 days with reassessment in the clinic whether or not to extend this course. Per urology given the multiple UTIs and epididymoorchitis b/l may benefit from cystoscopy to r/o underlying contributing factors.   Chronic diastolic congestive heart failure Noted on echocardiogram 2017 Continue Aldactone and Toprol-XL   Hypertension Stable. Continue metoprolol and Aldactone   Hyperlipidemia Continue statin   Discharge Diagnoses:  Active Problems:   Pyelonephritis   H/O orchitis   Cellulitis   Diastolic CHF, chronic (HCC)   Essential hypertension   Epididymoorchitis    Discharge Instructions  Discharge Instructions    Call MD for:  temperature >100.4   Complete by: As directed    Diet - low sodium heart healthy   Complete by: As directed    Increase activity slowly   Complete by: As  directed      Allergies as of 03/06/2020   No Known Allergies     Medication List    TAKE these medications   ciprofloxacin 500 MG tablet Commonly known as: CIPRO Take 1 tablet (500 mg total) by mouth 2 (two) times daily for 14 days.   glucosamine-chondroitin 500-400 MG tablet Take 1 tablet by mouth at bedtime.   Guaifenesin 1200 MG Tb12 Take 1,200 mg by mouth at bedtime as needed (for cough).   metoprolol succinate 25 MG 24 hr tablet Commonly known as: TOPROL-XL Take 25 mg by mouth daily.   multivitamin with minerals Tabs tablet Take 1 tablet by mouth at bedtime.   polycarbophil 625 MG tablet Commonly known as: FIBERCON Take 625 mg by mouth at bedtime.   rosuvastatin 10 MG tablet Commonly known as: CRESTOR Take 10 mg by mouth daily.   spironolactone 25 MG tablet Commonly known as: ALDACTONE Take 25 mg by mouth daily.       Follow-up Information    [name], MD Follow up in 1 week(s).   Specialty: Internal Medicine Contact information: [address] [place] [address] [phone]        [name], MD On 04/03/2020.   Specialty: Urology Why: at 145. YOU WILL HAVE A ULTRASOUND PRIOR ON 11/24 Contact information: [address] [place] [address] [phone]              No Known Allergies  Consultations: urology  Procedures/Studies: DG Chest Port 1 View  Result Date: 03/02/2020 CLINICAL DATA:  69 year old male with history of cough and shortness of breath. EXAM: PORTABLE CHEST  1 VIEW COMPARISON:  Chest x-ray 12/12/2019. FINDINGS: Lung volumes are normal. No consolidative airspace disease. No pleural effusions. No pneumothorax. No pulmonary nodule or mass noted. Pulmonary vasculature and the cardiomediastinal silhouette are within normal limits. IMPRESSION: No radiographic evidence of acute cardiopulmonary disease. Electronically Signed   By: Trudie Reed M.D.   On: 03/02/2020 18:07   CT Renal Stone  Study  Result Date: 03/02/2020 CLINICAL DATA:  UTI, LEFT side lower back pain radiating around to side and to groin, BILATERAL testicular swelling, history Crohn's disease EXAM: CT ABDOMEN AND PELVIS WITHOUT CONTRAST TECHNIQUE: Multidetector CT imaging of the abdomen and pelvis was performed following the standard protocol without IV contrast. Sagittal and coronal MPR images reconstructed from axial data set. No oral contrast administered COMPARISON:  None FINDINGS: Lower chest: Minimal atelectasis or scarring at lung bases Hepatobiliary: Gallbladder and liver normal appearance Pancreas: Normal appearance Spleen: Normal appearance Adrenals/Urinary Tract: Adrenal glands normal appearance. Kidneys, ureters, and bladder normal appearance. No urinary tract calcification or dilatation. No renal masses. Stomach/Bowel: Appendix not visualized, no pericecal inflammatory process seen. Stomach decompressed. Mild sigmoid diverticulosis without evidence of diverticulitis. Remaining bowel loops unremarkable. Vascular/Lymphatic: Few pelvic phleboliths. Atherosclerotic calcifications aorta and iliac arteries without aneurysm. No adenopathy. Reproductive: Mild prostatic enlargement, gland measuring 5.0 x 3.8 cm. Other: Small LEFT inguinal hernia containing fat. Mild edema along the RIGHT spermatic cord without hernia, nonspecific. No free air or free fluid. Small umbilical hernia containing fat. Musculoskeletal: Degenerative disc and facet disease changes lumbar spine. Grade 1 anterolisthesis L4-L5. Mild degenerative changes of the hip joints. IMPRESSION: Mild prostatic enlargement. Small LEFT inguinal and umbilical hernias containing fat. Mild sigmoid diverticulosis without evidence of diverticulitis. Mild nonspecific edema is seen along the RIGHT spermatic cord through the RIGHT inguinal canal, nonspecific; this could reflect inflammatory process but also could be seen with testicular torsion; recommend correlation with  physical exam and if clinically indicated scrotal ultrasound with scrotal Doppler. Aortic Atherosclerosis (ICD10-I70.0). Electronically Signed   By: Ulyses Southward M.D.   On: 03/02/2020 17:51   US SCROTUM W/DOPPLER  Result Date: 03/02/2020 CLINICAL DATA:  Testicular pain and swelling for 4 days EXAM: SCROTAL ULTRASOUND DOPPLER ULTRASOUND OF THE TESTICLES TECHNIQUE: Complete ultrasound examination of the testicles, epididymis, and other scrotal structures was performed. Color and spectral Doppler ultrasound were also utilized to evaluate blood flow to the testicles. COMPARISON:  01/30/2020 FINDINGS: Right testicle Measurements: 4.4 x 3.1 by 3.4 cm. No mass or microlithiasis visualized. Left testicle Measurements: 3.9 x 2.0 by 2.6 cm. Echotexture is grossly normal. The hypoechoic region seen within the left testicle and prior study has nearly completely resolved, now measuring only 2 x 2 x 1 mm, likely sequela of previous inflammation/infection. Right epididymis: Mildly enlarged compared to the left, measuring 14 x 8 by 14 mm. Increased vascularity. Left epididymis:  Normal in size and appearance. Hydrocele: Complex right hydrocele is identified with numerous septations. Varicocele:  None visualized. Pulsed Doppler interrogation of both testes demonstrates normal low resistance arterial and venous waveforms bilaterally. The right testicle and epididymis are hypervascular compared to the left, likely due to underlying infection. IMPRESSION: 1. Hypervascular right testicle and epididymis, consistent with epididymo-orchitis. No evidence of intra testicular abscess. 2. Complex right hydrocele, with multiple septations noted. 3. Postinflammatory/postinfectious change within the left testicle, with no residual fluid collection compared to prior study. Electronically Signed   By: Sharlet Salina M.D.   On: 03/02/2020 17:27       Subjective: Feels better ,  less swelling of scrotum  Discharge Exam: Vitals:    03/06/20 0804 03/06/20 1209  BP: (!) 141/81 120/85  Pulse: 75 81  Resp: 17 17  Temp: 98.6 F (37 C) 98.2 F (36.8 C)  SpO2: 94% 97%   Vitals:   03/05/20 2327 03/06/20 0340 03/06/20 0804 03/06/20 1209  BP: 126/71 127/78 (!) 141/81 120/85  Pulse: 79 82 75 81  Resp: 16 17 17 17   Temp: 98.5 F (36.9 C) 98.6 F (37 C) 98.6 F (37 C) 98.2 F (36.8 C)  TempSrc: Oral Oral  Oral  SpO2: 95% 94% 94% 97%  Weight:      Height:        General: Pt is alert, awake, not in acute distress Cardiovascular: RRR, S1/S2 +, no rubs, no gallops Respiratory: CTA bilaterally, no wheezing, no rhonchi Abdominal: Soft, NT, ND, bowel sounds + GU: scrotal swelling improving Extremities: no edema, no cyanosis    The results of significant diagnostics from this hospitalization (including imaging, microbiology, ancillary and laboratory) are listed below for reference.     Microbiology: Recent Results (from the past 240 hour(s))  Urine culture     Status: Abnormal   Collection Time: 03/02/20  1:48 PM   Specimen: Urine, Random  Result Value Ref Range Status   Specimen Description   Final    URINE, RANDOM Performed at [facility], [address], [place], [place] [address]    Special Requests   Final    NONE Performed at [facility], [address] [place], [place] [address]    Culture >=100,000 COLONIES/mL ESCHERICHIA COLI (A)  Final   Report Status 03/05/2020 FINAL  Final   Organism ID, Bacteria ESCHERICHIA COLI (A)  Final      Susceptibility   Escherichia coli - MIC*    AMPICILLIN <=2 SENSITIVE Sensitive     CEFAZOLIN <=4 SENSITIVE Sensitive     CEFEPIME <=0.12 SENSITIVE Sensitive     CEFTRIAXONE <=0.25 SENSITIVE Sensitive     CIPROFLOXACIN <=0.25 SENSITIVE Sensitive     GENTAMICIN <=1 SENSITIVE Sensitive     IMIPENEM <=0.25 SENSITIVE Sensitive     NITROFURANTOIN <=16 SENSITIVE Sensitive     TRIMETH/SULFA <=20 SENSITIVE Sensitive     AMPICILLIN/SULBACTAM <=2  SENSITIVE Sensitive     PIP/TAZO <=4 SENSITIVE Sensitive     * >=100,000 COLONIES/mL ESCHERICHIA COLI  Respiratory Panel by RT PCR (Flu A&B, Covid) - Nasopharyngeal Swab     Status: None   Collection Time: 03/02/20  4:22 PM   Specimen: Nasopharyngeal Swab  Result Value Ref Range Status   SARS Coronavirus 2 by RT PCR NEGATIVE NEGATIVE Final    Comment: (NOTE) SARS-CoV-2 target nucleic acids are NOT DETECTED.  The SARS-CoV-2 RNA is generally detectable in upper respiratoy specimens during the acute phase of infection. The lowest concentration of SARS-CoV-2 viral copies this assay can detect is 131 copies/mL. A negative result does not preclude SARS-Cov-2 infection and should not be used as the sole basis for treatment or other patient management decisions. A negative result may occur with  improper specimen collection/handling, submission of specimen other than nasopharyngeal swab, presence of viral mutation(s) within the areas targeted by this assay, and inadequate number of viral copies (<131 copies/mL). A negative result must be combined with clinical observations, patient history, and epidemiological information. The expected result is Negative.  Fact Sheet for Patients:  https://www.moore.com/  Fact Sheet for Healthcare Providers:  https://www.young.biz/  This test is no t yet approved  or cleared by the [place] and  has been authorized for detection and/or diagnosis of SARS-CoV-2 by FDA under an Emergency Use Authorization (EUA). This EUA will remain  in effect (meaning this test can be used) for the duration of the COVID-19 declaration under Section 564(b)(1) of the Act, 21 U.S.C. section 360bbb-3(b)(1), unless the authorization is terminated or revoked sooner.     Influenza A by PCR NEGATIVE NEGATIVE Final   Influenza B by PCR NEGATIVE NEGATIVE Final    Comment: (NOTE) The Xpert Xpress SARS-CoV-2/FLU/RSV assay is intended  as an aid in  the diagnosis of influenza from Nasopharyngeal swab specimens and  should not be used as a sole basis for treatment. Nasal washings and  aspirates are unacceptable for Xpert Xpress SARS-CoV-2/FLU/RSV  testing.  Fact Sheet for Patients: https://www.moore.com/  Fact Sheet for Healthcare Providers: https://www.young.biz/  This test is not yet approved or cleared by the [place] FDA and  has been authorized for detection and/or diagnosis of SARS-CoV-2 by  FDA under an Emergency Use Authorization (EUA). This EUA will remain  in effect (meaning this test can be used) for the duration of the  Covid-19 declaration under Section 564(b)(1) of the Act, 21  U.S.C. section 360bbb-3(b)(1), unless the authorization is  terminated or revoked. Performed at [facility], [address] [place], [place] [address]   Blood culture (routine x 2)     Status: None (Preliminary result)   Collection Time: 03/02/20  4:27 PM   Specimen: BLOOD  Result Value Ref Range Status   Specimen Description BLOOD LAC  Final   Special Requests   Final    BOTTLES DRAWN AEROBIC AND ANAEROBIC Blood Culture adequate volume   Culture   Final    NO GROWTH 4 DAYS Performed at [facility], [address] [place], [place] [address]    Report Status PENDING  Incomplete  Blood culture (routine x 2)     Status: None (Preliminary result)   Collection Time: 03/02/20  4:27 PM   Specimen: BLOOD  Result Value Ref Range Status   Specimen Description BLOOD RAC  Final   Special Requests   Final    BOTTLES DRAWN AEROBIC AND ANAEROBIC Blood Culture adequate volume   Culture   Final    NO GROWTH 4 DAYS Performed at [facility], [address] [place], [place] [address]    Report Status PENDING  Incomplete  Chlamydia/NGC rt PCR ([facility] only)     Status: None   Collection Time: 03/03/20  4:52 AM   Specimen: Urine  Result Value Ref Range Status    Specimen source GC/Chlam URINE, RANDOM  Final   Chlamydia Tr NOT DETECTED NOT DETECTED Final   N gonorrhoeae NOT DETECTED NOT DETECTED Final    Comment: (NOTE) This CT/NG assay has not been evaluated in patients with a history of  hysterectomy. Performed at [facility], [address] [place], [place] [address]   MRSA PCR Screening     Status: None   Collection Time: 03/03/20  4:52 AM   Specimen: Nasal Mucosa; Nasopharyngeal  Result Value Ref Range Status   MRSA by PCR NEGATIVE NEGATIVE Final    Comment:        The GeneXpert MRSA Assay (FDA approved for NASAL specimens only), is one component of a comprehensive MRSA colonization surveillance program. It is not intended to diagnose MRSA infection nor to guide or monitor treatment for MRSA infections. Performed at [facility], [address] [place], [place] [address]      Labs: BNP (last 3 results) No results for input(s): BNP in the last 8760 hours. Basic Metabolic Panel: Recent Labs  Lab 03/02/20 1348 03/03/20 0452 03/04/20 0728 03/05/20 0443 03/06/20 0543  NA 135 136 138 142 138  K 4.0 3.8 3.9 4.3 4.3  CL 100 100 103 106 103  CO2 21* GLUCOSE 199* 179* 134* 130* 124*  BUN CREATININE 1.11 1.09 1.04 0.86 0.92  CALCIUM 9.5 9.0 8.9 9.2 9.2   Liver Function Tests: Recent Labs  Lab 03/02/20 1348  AST 19  ALT 20  ALKPHOS 83  BILITOT 1.3*  PROT 7.8  ALBUMIN 3.8   No results for input(s): LIPASE, AMYLASE in the last 168 hours. No results for input(s): AMMONIA in the last 168 hours. CBC: Recent Labs  Lab 03/02/20 1348 03/03/20 0452 03/04/20 0728 03/05/20 0443 03/06/20 0543  WBC 21.2* 18.7* 14.5* 10.7* 9.4  NEUTROABS  --   --  11.8* 7.9* 6.6  HGB 13.8 12.2* 12.0* 12.3* 12.3*  HCT 39.2 34.7* 34.1* 35.3* 35.4*  MCV 90.3 92.0 91.4 90.5 90.8  PLT 281 279 289 328 358   Cardiac Enzymes: No results for input(s): CKTOTAL, CKMB, CKMBINDEX, TROPONINI in  the last 168 hours. BNP: Invalid input(s): POCBNP CBG: No results for input(s): GLUCAP in the last 168 hours. D-Dimer No results for input(s): DDIMER in the last 72 hours. Hgb A1c No results for input(s): HGBA1C in the last 72 hours. Lipid Profile No results for input(s): CHOL, HDL, LDLCALC, TRIG, CHOLHDL, LDLDIRECT in the last 72 hours. Thyroid function studies No results for input(s): TSH, T4TOTAL, T3FREE, THYROIDAB in the last 72 hours.  Invalid input(s): FREET3 Anemia work up No results for input(s): VITAMINB12, FOLATE, FERRITIN, TIBC, IRON, RETICCTPCT in the last 72 hours. Urinalysis    Component Value Date/Time   COLORURINE YELLOW (A) 03/02/2020 1348   APPEARANCEUR TURBID (A) 03/02/2020 1348   LABSPEC 1.017 03/02/2020 1348   PHURINE 5.0 03/02/2020 1348   GLUCOSEU NEGATIVE 03/02/2020 1348   HGBUR NEGATIVE 03/02/2020 1348   BILIRUBINUR NEGATIVE 03/02/2020 1348   KETONESUR 5 (A) 03/02/2020 1348   PROTEINUR 100 (A) 03/02/2020 1348   NITRITE NEGATIVE 03/02/2020 1348   LEUKOCYTESUR LARGE (A) 03/02/2020 1348   Sepsis Labs Invalid input(s): PROCALCITONIN,  WBC,  LACTICIDVEN Microbiology Recent Results (from the past 240 hour(s))  Urine culture     Status: Abnormal   Collection Time: 03/02/20  1:48 PM   Specimen: Urine, Random  Result Value Ref Range Status   Specimen Description   Final    URINE, RANDOM Performed at [facility], [address], [place], [place] [address]    Special Requests   Final    NONE Performed at [facility], [address] [place], [place] [address]    Culture >=100,000 COLONIES/mL ESCHERICHIA COLI (A)  Final   Report Status 03/05/2020 FINAL  Final   Organism ID, Bacteria ESCHERICHIA COLI (A)  Final      Susceptibility   Escherichia coli - MIC*    AMPICILLIN <=2 SENSITIVE Sensitive     CEFAZOLIN <=4 SENSITIVE Sensitive     CEFEPIME <=0.12 SENSITIVE Sensitive     CEFTRIAXONE <=0.25 SENSITIVE Sensitive      CIPROFLOXACIN <=0.25 SENSITIVE Sensitive     GENTAMICIN <=1 SENSITIVE Sensitive     IMIPENEM <=0.25 SENSITIVE Sensitive     NITROFURANTOIN <=  16 SENSITIVE Sensitive     TRIMETH/SULFA <=20 SENSITIVE Sensitive     AMPICILLIN/SULBACTAM <=2 SENSITIVE Sensitive     PIP/TAZO <=4 SENSITIVE Sensitive     * >=100,000 COLONIES/mL ESCHERICHIA COLI  Respiratory Panel by RT PCR (Flu A&B, Covid) - Nasopharyngeal Swab     Status: None   Collection Time: 03/02/20  4:22 PM   Specimen: Nasopharyngeal Swab  Result Value Ref Range Status   SARS Coronavirus 2 by RT PCR NEGATIVE NEGATIVE Final    Comment: (NOTE) SARS-CoV-2 target nucleic acids are NOT DETECTED.  The SARS-CoV-2 RNA is generally detectable in upper respiratoy specimens during the acute phase of infection. The lowest concentration of SARS-CoV-2 viral copies this assay can detect is 131 copies/mL. A negative result does not preclude SARS-Cov-2 infection and should not be used as the sole basis for treatment or other patient management decisions. A negative result may occur with  improper specimen collection/handling, submission of specimen other than nasopharyngeal swab, presence of viral mutation(s) within the areas targeted by this assay, and inadequate number of viral copies (<131 copies/mL). A negative result must be combined with clinical observations, patient history, and epidemiological information. The expected result is Negative.  Fact Sheet for Patients:  https://www.moore.com/  Fact Sheet for Healthcare Providers:  https://www.young.biz/  This test is no t yet approved or cleared by the [place] FDA and  has been authorized for detection and/or diagnosis of SARS-CoV-2 by FDA under an Emergency Use Authorization (EUA). This EUA will remain  in effect (meaning this test can be used) for the duration of the COVID-19 declaration under Section 564(b)(1) of the Act, 21 U.S.C. section  360bbb-3(b)(1), unless the authorization is terminated or revoked sooner.     Influenza A by PCR NEGATIVE NEGATIVE Final   Influenza B by PCR NEGATIVE NEGATIVE Final    Comment: (NOTE) The Xpert Xpress SARS-CoV-2/FLU/RSV assay is intended as an aid in  the diagnosis of influenza from Nasopharyngeal swab specimens and  should not be used as a sole basis for treatment. Nasal washings and  aspirates are unacceptable for Xpert Xpress SARS-CoV-2/FLU/RSV  testing.  Fact Sheet for Patients: https://www.moore.com/  Fact Sheet for Healthcare Providers: https://www.young.biz/  This test is not yet approved or cleared by the [place] FDA and  has been authorized for detection and/or diagnosis of SARS-CoV-2 by  FDA under an Emergency Use Authorization (EUA). This EUA will remain  in effect (meaning this test can be used) for the duration of the  Covid-19 declaration under Section 564(b)(1) of the Act, 21  U.S.C. section 360bbb-3(b)(1), unless the authorization is  terminated or revoked. Performed at [facility], [address] [place], [place] [address]   Blood culture (routine x 2)     Status: None (Preliminary result)   Collection Time: 03/02/20  4:27 PM   Specimen: BLOOD  Result Value Ref Range Status   Specimen Description BLOOD LAC  Final   Special Requests   Final    BOTTLES DRAWN AEROBIC AND ANAEROBIC Blood Culture adequate volume   Culture   Final    NO GROWTH 4 DAYS Performed at [facility], [address] [place], [place] [address]    Report Status PENDING  Incomplete  Blood culture (routine x 2)     Status: None (Preliminary result)   Collection Time: 03/02/20  4:27 PM   Specimen: BLOOD  Result Value Ref Range Status   Specimen Description BLOOD RAC  Final   Special Requests  Final    BOTTLES DRAWN AEROBIC AND ANAEROBIC Blood Culture adequate volume   Culture   Final    NO GROWTH 4 DAYS Performed at  [facility], [address] [place], [place] [address]    Report Status PENDING  Incomplete  Chlamydia/NGC rt PCR ([facility] only)     Status: None   Collection Time: 03/03/20  4:52 AM   Specimen: Urine  Result Value Ref Range Status   Specimen source GC/Chlam URINE, RANDOM  Final   Chlamydia Tr NOT DETECTED NOT DETECTED Final   N gonorrhoeae NOT DETECTED NOT DETECTED Final    Comment: (NOTE) This CT/NG assay has not been evaluated in patients with a history of  hysterectomy. Performed at [facility], [address] [place], [place] [address]   MRSA PCR Screening     Status: None   Collection Time: 03/03/20  4:52 AM   Specimen: Nasal Mucosa; Nasopharyngeal  Result Value Ref Range Status   MRSA by PCR NEGATIVE NEGATIVE Final    Comment:        The GeneXpert MRSA Assay (FDA approved for NASAL specimens only), is one component of a comprehensive MRSA colonization surveillance program. It is not intended to diagnose MRSA infection nor to guide or monitor treatment for MRSA infections. Performed at [facility], [address] [place], [place] [address]      Time coordinating discharge: Over 30 minutes  SIGNED:   Lynn Ito, MD  [facility] 03/06/2020, 2:28 PM Pager   If 7PM-7AM, please contact night-coverage [email or web address] Password [email or web address]

## 2020-03-06 NOTE — Plan of Care (Signed)
?  Problem: Health Behavior/Discharge Planning: ?Goal: Ability to manage health-related needs will improve ?Outcome: Progressing ?  ?Problem: Clinical Measurements: ?Goal: Ability to maintain clinical measurements within normal limits will improve ?Outcome: Progressing ?Goal: Will remain free from infection ?Outcome: Progressing ?Goal: Diagnostic test results will improve ?Outcome: Progressing ?  ?Problem: Activity: ?Goal: Risk for activity intolerance will decrease ?Outcome: Progressing ?  ?

## 2020-03-06 NOTE — Progress Notes (Signed)
Discharge education reviewed with patient.  Verbalized understanding. No s/s of distress, VSS. Pt transported home via friend in private vehicle.

## 2020-03-07 LAB — CULTURE, BLOOD (ROUTINE X 2)
Culture: NO GROWTH
Culture: NO GROWTH
Special Requests: ADEQUATE
Special Requests: ADEQUATE

## 2020-03-19 ENCOUNTER — Other Ambulatory Visit: Payer: Self-pay

## 2020-03-19 ENCOUNTER — Ambulatory Visit
Admission: RE | Admit: 2020-03-19 | Discharge: 2020-03-19 | Disposition: A | Discharge status: Home / Self Care | Payer: Medicare Other | Source: Ambulatory Visit | Attending: Urology | Admitting: Urology

## 2020-03-19 DIAGNOSIS — N452 Orchitis: Secondary | ICD-10-CM | POA: Diagnosis not present

## 2020-04-03 ENCOUNTER — Other Ambulatory Visit: Payer: Self-pay

## 2020-04-03 ENCOUNTER — Encounter: Payer: Self-pay | Admitting: Urology

## 2020-04-03 ENCOUNTER — Ambulatory Visit: Payer: Medicare Other | Admitting: Urology

## 2020-04-03 VITALS — BP 144/84 | HR 79 | Ht 69.0 in

## 2020-04-03 DIAGNOSIS — N401 Enlarged prostate with lower urinary tract symptoms: Secondary | ICD-10-CM | POA: Diagnosis not present

## 2020-04-03 DIAGNOSIS — N390 Urinary tract infection, site not specified: Secondary | ICD-10-CM

## 2020-04-03 DIAGNOSIS — N453 Epididymo-orchitis: Secondary | ICD-10-CM

## 2020-04-03 DIAGNOSIS — N138 Other obstructive and reflux uropathy: Secondary | ICD-10-CM

## 2020-04-03 LAB — BLADDER SCAN AMB NON-IMAGING

## 2020-04-03 MED ORDER — TAMSULOSIN HCL 0.4 MG PO CAPS
0.4000 mg | ORAL_CAPSULE | Freq: Every day | ORAL | 11 refills | Status: DC
Start: 2020-04-03 — End: 2020-10-15

## 2020-04-03 NOTE — Patient Instructions (Signed)

## 2020-04-03 NOTE — Progress Notes (Signed)
   04/03/2020 2:00 PM   Douglas Humphrey [birth year] [id number]  Reason for visit: Follow up recurrent epididymoorchitis  HPI: I saw [name] for follow-up of recurrent epididymoorchitis.  He is a 69 year old healthy male who I originally saw in October 2021 after he was treated by his PCP for left-sided epididymoorchitis.  This resolved with doxycycline, and repeat urine culture was negative.  He was then admitted in early November with right-sided epididymoorchitis and treated with 2 weeks of Cipro.  Cultures grew E. coli both episodes.  A CT was also performed at that admission, and showed no stone disease or hydronephrosis, and mildly enlarged prostate of 42 g.  I personally viewed and interpreted the images.  He reports that his symptoms have resolved after being treated with 2 weeks of Cipro, and has no complaints today.  IPSS score today is 5, with quality of life mixed, however PVR is elevated at 311 mL despite just voiding.  He reports that he has some urinary dribbling when he initiates urination, and sometimes has to return to the bathroom to empty again within just a few minutes of voiding.  He has very mild occasional leakage.  We long conversation about possible etiologies of recurrent UTIs/epididymoorchitis, and I recommended a cystoscopy to evaluate for urethral stricture or BPH.  We discussed possible need for future interventions pending these findings.  I also recommended a trial of Flomax to see if this improves some of his urinary symptoms and decreases his PVRs.  Trial of Flomax for elevated PVR and urinary symptoms with recurrent infections Follow-up in 1 to 2 weeks for cystoscopy, repeat PVR at that visit   [name], MD  [facility] [address] [place], [place] [address] [phone]

## 2020-04-03 NOTE — Addendum Note (Signed)
Addended by: Martha Clan on: 04/03/2020 03:09 PM   Modules accepted: Orders

## 2020-04-04 LAB — MICROSCOPIC EXAMINATION
Bacteria, UA: NONE SEEN
Epithelial Cells (non renal): NONE SEEN /hpf (ref 0–10)
RBC, Urine: NONE SEEN /hpf (ref 0–2)

## 2020-04-04 LAB — URINALYSIS, COMPLETE
Bilirubin, UA: NEGATIVE
Glucose, UA: NEGATIVE
Ketones, UA: NEGATIVE
Leukocytes,UA: NEGATIVE
Nitrite, UA: NEGATIVE
Protein,UA: NEGATIVE
RBC, UA: NEGATIVE
Specific Gravity, UA: <1.005 — ABNORMAL LOW (ref 1.005–1.030)
Urobilinogen, Ur: 0.2 mg/dL (ref 0.2–1.0)
pH, UA: 5.5 (ref 5.0–7.5)

## 2020-04-10 ENCOUNTER — Other Ambulatory Visit: Payer: Medicare Other | Admitting: Urology

## 2020-04-16 ENCOUNTER — Other Ambulatory Visit: Payer: Self-pay

## 2020-04-16 ENCOUNTER — Encounter: Payer: Self-pay | Admitting: Urology

## 2020-04-16 ENCOUNTER — Ambulatory Visit (INDEPENDENT_AMBULATORY_CARE_PROVIDER_SITE_OTHER): Payer: Medicare Other | Admitting: Urology

## 2020-04-16 VITALS — BP 165/84 | HR 80 | Ht 69.0 in | Wt 199.0 lb

## 2020-04-16 DIAGNOSIS — N390 Urinary tract infection, site not specified: Secondary | ICD-10-CM | POA: Diagnosis not present

## 2020-04-16 DIAGNOSIS — N401 Enlarged prostate with lower urinary tract symptoms: Secondary | ICD-10-CM

## 2020-04-16 DIAGNOSIS — N138 Other obstructive and reflux uropathy: Secondary | ICD-10-CM | POA: Diagnosis not present

## 2020-04-16 LAB — URINALYSIS, COMPLETE
Bilirubin, UA: NEGATIVE
Glucose, UA: NEGATIVE
Ketones, UA: NEGATIVE
Leukocytes,UA: NEGATIVE
Nitrite, UA: NEGATIVE
Protein,UA: NEGATIVE
RBC, UA: NEGATIVE
Specific Gravity, UA: 1.02 (ref 1.005–1.030)
Urobilinogen, Ur: 0.2 mg/dL (ref 0.2–1.0)
pH, UA: 6 (ref 5.0–7.5)

## 2020-04-16 LAB — BLADDER SCAN AMB NON-IMAGING

## 2020-04-16 LAB — MICROSCOPIC EXAMINATION
Bacteria, UA: NONE SEEN
Epithelial Cells (non renal): NONE SEEN /hpf (ref 0–10)

## 2020-04-16 MED ORDER — LIDOCAINE HCL URETHRAL/MUCOSAL 2 % EX GEL
1.0000 "application " | Freq: Once | CUTANEOUS | Status: AC
  Administered 2020-04-16: 1 via URETHRAL

## 2020-04-16 MED ORDER — SULFAMETHOXAZOLE-TRIMETHOPRIM 800-160 MG PO TABS
1.0000 | ORAL_TABLET | Freq: Once | ORAL | Status: AC
  Administered 2020-04-16: 1 via ORAL

## 2020-04-16 NOTE — Progress Notes (Signed)
Cystoscopy Procedure Note:  Indication: Recurrent UTI/epididymitis, urinary symptoms  He notes his urinary symptoms have improved since starting Flomax and has a better stream with less nocturia.  After informed consent and discussion of the procedure and its risks, Jad Johansson was positioned and prepped in the standard fashion. Cystoscopy was performed with a flexible cystoscope. The urethra, bladder neck and entire bladder was visualized in a standard fashion. The prostate was moderate in size with a median lobe. The ureteral orifices were visualized in their normal location and orientation.  Moderate bladder trabeculations.  On retroflexion there was an intravesical protrusion of the prostate.  No suspicious bladder lesions.  PVR after cystoscopy is normal at 30 mL.  Prostate measured 42 g on recent CT  Findings: No urethral strictures, moderate size prostate with some obstruction ------------------------------------------------------------------------------------------  Assessment and Plan: We reviewed his recurrent UTIs/epididymitis over the last few months, and single elevated bladder scan of 300 mL in clinic at our last visit.  I suspect he has some obstruction from his BPH causing his symptoms and recurrent infections.  He would like to avoid any intervention or surgery if possible, as he is minimally bothered by his urinary symptoms.  Fortunately, PVR is normal at 30 mL today after starting Flomax 2 weeks ago.  We reviewed return precautions at length including urinary retention, recurrent infections, or worsening urinary symptoms.  We discussed the risks and benefits of HoLEP at length.  The procedure requires general anesthesia and takes 2 to 3 hours, and a holmium laser is used to enucleate the prostate and push this tissue into the bladder.  A morcellator is then used to remove this tissue, which is sent for pathology.  The vast majority of patients are able to discharge the same  day with a catheter in place for 2 to 3 days, and will follow-up in clinic for a voiding trial.  Approximately 5% of patients will be admitted overnight to monitor the urine, or if they have multiple co-morbidities.  We specifically discussed the risks of bleeding, infection, retrograde ejaculation, temporary urgency and urge incontinence, very low risk of long-term incontinence, pathologic evaluation of prostate tissue and possible detection of prostate cancer or other malignancy, and possible need for additional procedures.  RTC 6 months with IPSS and PVR Continue Flomax Return precautions discussed extensively  Legrand Rams, MD 04/16/2020

## 2020-04-16 NOTE — Patient Instructions (Signed)

## 2020-05-07 ENCOUNTER — Encounter: Payer: Self-pay | Admitting: Pulmonary Disease

## 2020-05-07 ENCOUNTER — Other Ambulatory Visit: Payer: Self-pay

## 2020-05-07 ENCOUNTER — Ambulatory Visit
Admission: RE | Admit: 2020-05-07 | Discharge: 2020-05-07 | Disposition: A | Discharge status: Home / Self Care | Payer: Medicare Other | Source: Ambulatory Visit | Attending: Pulmonary Disease | Admitting: Pulmonary Disease

## 2020-05-07 ENCOUNTER — Other Ambulatory Visit
Admission: RE | Admit: 2020-05-07 | Discharge: 2020-05-07 | Disposition: A | Discharge status: Home / Self Care | Payer: Medicare Other | Source: Ambulatory Visit | Attending: Pulmonary Disease | Admitting: Pulmonary Disease

## 2020-05-07 ENCOUNTER — Ambulatory Visit: Payer: Medicare Other | Admitting: Pulmonary Disease

## 2020-05-07 VITALS — BP 124/70 | HR 87 | Temp 97.8°F | Ht 69.0 in | Wt 207.4 lb

## 2020-05-07 DIAGNOSIS — R0602 Shortness of breath: Secondary | ICD-10-CM | POA: Insufficient documentation

## 2020-05-07 DIAGNOSIS — Z8616 Personal history of COVID-19: Secondary | ICD-10-CM

## 2020-05-07 DIAGNOSIS — I340 Nonrheumatic mitral (valve) insufficiency: Secondary | ICD-10-CM | POA: Diagnosis not present

## 2020-05-07 DIAGNOSIS — I351 Nonrheumatic aortic (valve) insufficiency: Secondary | ICD-10-CM

## 2020-05-07 NOTE — Patient Instructions (Signed)
It appears that you have been having shortness of breath since at least 2017. And appears that you have worsened over the last 6 months to a year. Further investigation is necessary.  Shortness of breath is a complex symptom and it can give [place] clues as to problems in the lungs, heart, muscles etc.  Breathing test will help [place] determine about your lung capacity. Your oxygen level today was excellent when you walked.  We will check for hereditary emphysema to make sure that this is not an issue. We will repeat a chest x-ray as well.  We will see you in follow-up in 4 to 6 weeks time call sooner should any new problems arise.   Currently there is no indication that you need an inhaler or that you have lung disease due to obstruction. Pending on the tests above you may need further evaluation with other tests. One possibility is that you may need a cardiac catheterization.

## 2020-05-07 NOTE — Progress Notes (Signed)
Subjective:    Patient ID: Douglas Humphrey, male    DOB: 17-Jan-1951, 70 y.o.   MRN: [id number]  HPI Douglas Humphrey is a 70 year old gentleman, lifelong never smoker, who presents for evaluation of dyspnea of approximately 1 to 2 years duration. He noted worsening of symptoms after COVID-19 in late August 2021. He is kindly referred by Dr. Adrian Blackwater. The patient is a somewhat challenging historian and tends to digress multiple times during interview. He states that he has had pneumonia several times in the last 5 years. He states that he gets winded easily and has noted this started approximately 1-2 years ago. His records show that he did get an evaluation by Dr. Ned Clines in 2017 for the same symptom. So it is hard to pinpoint exactly when his dyspnea started, by records it appears that as far back as 2017. It appears that at that time he was given albuterol to use as needed, the patient never found any relief with this inhaler. He states that walking distances and strenuous work aggravate his symptoms. Rest improves these. He never notices any wheezing. Normal activity is okay and he can do normal nonstrenuous work indefinitely without tiring. He has had issues with gastroesophageal reflux in the past but these have resolved and now currently controlled with over-the-counter antireflux medications. He has not had any recent fevers, chills or sweats. No cough or sputum production. He does not endorse any orthopnea, paroxysmal nocturnal dyspnea or lower extremity edema.  Patient resided with his parents for approximately 25 years they both smoked. He states that his mother died at age 9 with emphysema. He worked in Teacher, early years/pre for 16 years. He has no military history and no other occupational exposure. He lived in [place] prior to moving to [place]. No exposure to tuberculosis no travel outside of the country.  He does not endorse any other  symptomatology.  I have reviewed the records from Dr. Santo Held office. Patient had a clear stress test performed with Persantine. This showed no significant ST's changes with Persantine. 2D echo performed 10/17/2019 shows moderate mitral regurgitation and moderate aortic valve regurgitation and mild to moderate tricuspid regurgitation. Normal pulmonary artery pressure noted on echo however with the regurgitant valves this may be an underestimation. On both studies it appears that his LVEF is hyperdynamic.  Review of spirometric data performed during Dr. Reita Cliche evaluation in 2017 showed that there was no obstructive defect. The flow volume loop was normal and the study was interpreted as normal.  Review of Systems A 10 point review of systems was performed and it is as noted above otherwise negative.  Past Medical History:  Diagnosis Date  . Chronic back pain   . Crohn's disease (HCC)   . Leaky heart valve    Past Surgical History:  Procedure Laterality Date  . NO PAST SURGERIES     Patient Active Problem List   Diagnosis Date Noted  . H/O orchitis 03/03/2020  . Cellulitis 03/03/2020  . Diastolic CHF, chronic (HCC) 03/03/2020  . Essential hypertension 03/03/2020  . Epididymoorchitis 03/03/2020  . Pyelonephritis 03/02/2020  . Shortness of breath 05/14/2015   Family History  Problem Relation Age of Onset  . CAD Mother   . COPD Mother   . CAD Father    Social History   Tobacco Use  . Smoking status: Never Smoker  . Smokeless tobacco: Never Used  Substance Use Topics  . Alcohol use: No   No Known  Allergies Current Meds  Medication Sig  . glucosamine-chondroitin 500-400 MG tablet Take 1 tablet by mouth at bedtime.  . Guaifenesin 1200 MG TB12 Take 1,200 mg by mouth at bedtime as needed (for cough).  . metoprolol succinate (TOPROL-XL) 25 MG 24 hr tablet Take 25 mg by mouth daily.  . Multiple Vitamin (MULTIVITAMIN WITH MINERALS) TABS tablet Take 1 tablet by mouth at bedtime.   . polycarbophil (FIBERCON) 625 MG tablet Take 625 mg by mouth at bedtime.  . rosuvastatin (CRESTOR) 10 MG tablet Take 10 mg by mouth daily.  Marland Kitchen spironolactone (ALDACTONE) 25 MG tablet Take 25 mg by mouth daily.  . tamsulosin (FLOMAX) 0.4 MG CAPS capsule Take 1 capsule (0.4 mg total) by mouth daily.   Immunization History  Administered Date(s) Administered  . Influenza Split 01/25/2020   We discussed Covid-19 precautions.  I reviewed the vaccine effectiveness and potential side effects in detail to include differences between mRNA vaccines and traditional vaccines (attenuated virus).  Discussion also offered of long-term effectiveness and safety profile which are unclear at this time.  Discussed current CDC guidance that all patients are recommended COVID-19 vaccinations [when available]-as long as they do not have allergy to components of the vaccine.     Objective:   Physical Exam BP 124/70 (BP Location: Left Arm, Cuff Size: Normal)   Pulse 87   Temp 97.8 F (36.6 C) (Temporal)   Ht 5\' 9"  (1.753 m)   Wt 207 lb 6.4 oz (94.1 kg)   SpO2 98%   BMI 30.63 kg/m  GENERAL: Well-developed, overweight gentleman in no acute distress. Fully ambulatory. HEAD: Normocephalic, atraumatic.  EYES: Pupils equal, round, reactive to light.  No scleral icterus.  MOUTH: Nose/mouth/throat not examined due to masking requirements for COVID 19. NECK: Supple. No thyromegaly. Trachea midline. No JVD.  No adenopathy. PULMONARY: Good air entry bilaterally.  No adventitious sounds. CARDIOVASCULAR: S1 and S2. Regular rate and rhythm. There is a soft diastolic murmur at Erb's point. There is a grade 2/6 systolic ejection murmur at the apex query radiation. ABDOMEN: Benign. MUSCULOSKELETAL: No joint deformity, no clubbing, no edema.  NEUROLOGIC: No overt focal deficit, no gait disturbance, speech is fluent SKIN: Intact,warm,dry. PSYCH: Flat affect, normal behavior.  This x-ray obtained today acute process:      Assessment & Plan:     ICD-10-CM   1. Shortness of breath  R06.02 Pulmonary Function Test [facility] Only    DG Chest 2 View    Alpha-1 antitrypsin phenotype    CANCELED: Alpha-1 antitrypsin phenotype   Suspect this is multifactorial Obtain PFTs, chest x-ray, alpha-1 antitrypsin  2. Moderate aortic valve regurgitation  I35.1    This issue adds complexity to his management  3. Moderate mitral regurgitation  I34.0    This issue adds complexity to his management  4. Personal history of COVID-19  Z86.16    No evidence of residual parenchymal disease   Orders Placed This Encounter  Procedures  . DG Chest 2 View    Standing Status:   Future    Number of Occurrences:   1    Standing Expiration Date:   11/04/2020    Order Specific Question:   Reason for Exam (SYMPTOM  OR DIAGNOSIS REQUIRED)    Answer:   sob    Order Specific Question:   Preferred imaging location?    Answer:    [facility]  . Alpha-1 antitrypsin phenotype    Standing Status:   Future    Number of Occurrences:  1    Standing Expiration Date:   05/07/2021  . Pulmonary Function Test [facility] Only    Standing Status:   Future    Standing Expiration Date:   05/07/2021    Scheduling Instructions:     Next available.    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 Patient will have an alpha-1 antitrypsin performed, we will obtain pulmonary function testing. Chest x-ray obtained today showed no acute abnormality. Depending on the results of the PFTs will determine if further  work-up is necessary such as CT of the chest or evaluation for potential pulmonary hypertension. Given the number of regurgitant valves noted on echocardiogram he may benefit from left and right heart cath. We will see the patient in follow-up in 4 to 6 weeks time he is to contact us prior to that time should any new difficulties arise.  Gailen Shelter, MD [facility]   *This note was dictated using voice recognition software/Dragon.  Despite best efforts to proofread, errors can occur which can change the meaning.  Any change was purely unintentional.

## 2020-05-09 ENCOUNTER — Telehealth: Payer: Self-pay

## 2020-05-09 LAB — ALPHA-1 ANTITRYPSIN PHENOTYPE: A-1 Antitrypsin, Ser: 125 mg/dL (ref 101–187)

## 2020-05-09 NOTE — Telephone Encounter (Signed)
Lm to relay date/time of covid test piror to PFT.  [facility]/13/[facility]

## 2020-05-09 NOTE — Telephone Encounter (Signed)
Patient is aware of date/time of covid test.   

## 2020-05-13 ENCOUNTER — Other Ambulatory Visit: Payer: Medicare Other

## 2020-05-13 ENCOUNTER — Telehealth: Payer: Self-pay | Admitting: Pulmonary Disease

## 2020-05-13 NOTE — Telephone Encounter (Signed)
Douglas Humphrey PFT has been CXL for 1/19 he stated he can't get here today to do the Covid Test.  He prefers Wednesday and Friday afternoons but at this time all appts are filled up. Douglas Humphrey has made notes to get his PFT rescheduled before 2/14 appt with Douglas Humphrey

## 2020-05-14 ENCOUNTER — Ambulatory Visit: Payer: Medicare Other

## 2020-05-23 NOTE — Telephone Encounter (Signed)
I now have Douglas Humphrey PFT appt rescheduled for 05/30/2020 @ 2:30pm Covid Test scheduled on 02//03/22. Douglas Humphrey is aware of the new appts

## 2020-05-28 ENCOUNTER — Telehealth: Payer: Self-pay | Admitting: Pulmonary Disease

## 2020-05-28 NOTE — Telephone Encounter (Signed)
Patient is aware date/time of covid test.   

## 2020-05-29 ENCOUNTER — Other Ambulatory Visit
Admission: RE | Admit: 2020-05-29 | Discharge: 2020-05-29 | Disposition: A | Discharge status: Home / Self Care | Payer: Medicare Other | Source: Ambulatory Visit | Attending: Pulmonary Disease | Admitting: Pulmonary Disease

## 2020-05-29 ENCOUNTER — Other Ambulatory Visit: Payer: Self-pay

## 2020-05-29 DIAGNOSIS — Z01812 Encounter for preprocedural laboratory examination: Secondary | ICD-10-CM | POA: Insufficient documentation

## 2020-05-29 DIAGNOSIS — Z20822 Contact with and (suspected) exposure to covid-19: Secondary | ICD-10-CM | POA: Insufficient documentation

## 2020-05-30 ENCOUNTER — Ambulatory Visit: Payer: Medicare Other | Attending: Pulmonary Disease

## 2020-05-30 DIAGNOSIS — R0602 Shortness of breath: Secondary | ICD-10-CM | POA: Diagnosis not present

## 2020-05-30 LAB — SARS CORONAVIRUS 2 (TAT 6-24 HRS): SARS Coronavirus 2: NEGATIVE

## 2020-05-30 MED ORDER — ALBUTEROL SULFATE (2.5 MG/3ML) 0.083% IN NEBU
2.5000 mg | INHALATION_SOLUTION | Freq: Once | RESPIRATORY_TRACT | Status: AC
  Administered 2020-05-30: 2.5 mg via RESPIRATORY_TRACT
  Filled 2020-05-30: qty 3

## 2020-06-02 LAB — PULMONARY FUNCTION TEST ARMC ONLY
DL/VA % pred: 78 %
DL/VA: 3.21 ml/min/mmHg/L
DLCO unc % pred: 75 %
DLCO unc: 19.25 ml/min/mmHg
FEF 25-75 Post: 3.9 L/sec
FEF 25-75 Pre: 3.77 L/sec
FEF2575-%Change-Post: 3 %
FEF2575-%Pred-Post: 156 %
FEF2575-%Pred-Pre: 151 %
FEV1-%Change-Post: 0 %
FEV1-%Pred-Post: 107 %
FEV1-%Pred-Pre: 107 %
FEV1-Post: 3.46 L
FEV1-Pre: 3.44 L
FEV1FVC-%Change-Post: 2 %
FEV1FVC-%Pred-Pre: 109 %
FEV6-%Change-Post: -1 %
FEV6-%Pred-Post: 102 %
FEV6-%Pred-Pre: 103 %
FEV6-Post: 4.18 L
FEV6-Pre: 4.24 L
FEV6FVC-%Pred-Post: 105 %
FEV6FVC-%Pred-Pre: 105 %
FVC-%Change-Post: -1 %
FVC-%Pred-Post: 96 %
FVC-%Pred-Pre: 97 %
FVC-Post: 4.18 L
Post FEV1/FVC ratio: 83 %
Post FEV6/FVC ratio: 100 %
Pre FEV1/FVC ratio: 81 %
Pre FEV6/FVC Ratio: 100 %
RV % pred: 75 %
RV: 1.79 L
TLC % pred: 88 %
TLC: 6.02 L

## 2020-06-09 ENCOUNTER — Ambulatory Visit: Payer: Medicare Other | Admitting: Pulmonary Disease

## 2020-09-19 DIAGNOSIS — E785 Hyperlipidemia, unspecified: Secondary | ICD-10-CM | POA: Diagnosis not present

## 2020-09-19 DIAGNOSIS — I10 Essential (primary) hypertension: Secondary | ICD-10-CM | POA: Diagnosis not present

## 2020-09-19 DIAGNOSIS — R7303 Prediabetes: Secondary | ICD-10-CM | POA: Diagnosis not present

## 2020-09-22 DIAGNOSIS — E782 Mixed hyperlipidemia: Secondary | ICD-10-CM | POA: Diagnosis not present

## 2020-09-22 DIAGNOSIS — I340 Nonrheumatic mitral (valve) insufficiency: Secondary | ICD-10-CM | POA: Diagnosis not present

## 2020-09-22 DIAGNOSIS — R0602 Shortness of breath: Secondary | ICD-10-CM | POA: Diagnosis not present

## 2020-09-22 DIAGNOSIS — I509 Heart failure, unspecified: Secondary | ICD-10-CM | POA: Diagnosis not present

## 2020-09-22 DIAGNOSIS — E785 Hyperlipidemia, unspecified: Secondary | ICD-10-CM | POA: Diagnosis not present

## 2020-09-22 DIAGNOSIS — R339 Retention of urine, unspecified: Secondary | ICD-10-CM | POA: Diagnosis not present

## 2020-09-22 DIAGNOSIS — I10 Essential (primary) hypertension: Secondary | ICD-10-CM | POA: Diagnosis not present

## 2020-09-22 DIAGNOSIS — R7303 Prediabetes: Secondary | ICD-10-CM | POA: Diagnosis not present

## 2020-09-29 DIAGNOSIS — I509 Heart failure, unspecified: Secondary | ICD-10-CM | POA: Diagnosis not present

## 2020-10-15 ENCOUNTER — Ambulatory Visit (INDEPENDENT_AMBULATORY_CARE_PROVIDER_SITE_OTHER): Payer: Medicare Other | Admitting: Urology

## 2020-10-15 ENCOUNTER — Encounter: Payer: Self-pay | Admitting: Urology

## 2020-10-15 ENCOUNTER — Other Ambulatory Visit: Payer: Self-pay

## 2020-10-15 VITALS — BP 112/71 | HR 77 | Ht 69.0 in | Wt 198.0 lb

## 2020-10-15 DIAGNOSIS — N401 Enlarged prostate with lower urinary tract symptoms: Secondary | ICD-10-CM | POA: Diagnosis not present

## 2020-10-15 DIAGNOSIS — N138 Other obstructive and reflux uropathy: Secondary | ICD-10-CM | POA: Diagnosis not present

## 2020-10-15 LAB — BLADDER SCAN AMB NON-IMAGING

## 2020-10-15 MED ORDER — TAMSULOSIN HCL 0.4 MG PO CAPS
0.4000 mg | ORAL_CAPSULE | Freq: Every day | ORAL | 11 refills | Status: DC
Start: 2020-10-15 — End: 2023-01-03

## 2020-10-15 NOTE — Progress Notes (Signed)
   10/15/2020 1:09 PM   Douglas Humphrey [birth year] [id number]  Reason for visit: Follow up BPH, history of epididymitis  HPI: 70 year old male who had an episode of epididymoorchitis in November 2021 that resolved with antibiotics.  He underwent further evaluation with cystoscopy in December 2021 that showed no evidence of strictures, and a moderate size prostate that measured 42 g on CT.  He noticed significant improvement in his urinary symptoms on Flomax, and PVR was normal at that visit at 30 mL.  He continues to do well with really no urinary complaints today.  PVR is again normal at 3mL.  He continues on the Flomax.  IPSS score today is 5, with quality of life pleased.  He denies any UTIs, gross hematuria, or other problems since our last visit.  Continue Flomax RTC 1 year for PVR, if doing well at that time can likely follow-up with PCP  [name], MD  [facility] [address] [place], [place] [address] [phone]

## 2020-11-25 DIAGNOSIS — I340 Nonrheumatic mitral (valve) insufficiency: Secondary | ICD-10-CM | POA: Diagnosis not present

## 2020-11-25 DIAGNOSIS — I10 Essential (primary) hypertension: Secondary | ICD-10-CM | POA: Diagnosis not present

## 2020-11-25 DIAGNOSIS — I351 Nonrheumatic aortic (valve) insufficiency: Secondary | ICD-10-CM | POA: Diagnosis not present

## 2020-11-25 DIAGNOSIS — I509 Heart failure, unspecified: Secondary | ICD-10-CM | POA: Diagnosis not present

## 2021-01-30 DIAGNOSIS — R339 Retention of urine, unspecified: Secondary | ICD-10-CM | POA: Diagnosis not present

## 2021-01-30 DIAGNOSIS — E785 Hyperlipidemia, unspecified: Secondary | ICD-10-CM | POA: Diagnosis not present

## 2021-01-30 DIAGNOSIS — I10 Essential (primary) hypertension: Secondary | ICD-10-CM | POA: Diagnosis not present

## 2021-01-30 DIAGNOSIS — R7303 Prediabetes: Secondary | ICD-10-CM | POA: Diagnosis not present

## 2021-02-02 DIAGNOSIS — I10 Essential (primary) hypertension: Secondary | ICD-10-CM | POA: Diagnosis not present

## 2021-02-02 DIAGNOSIS — Z23 Encounter for immunization: Secondary | ICD-10-CM | POA: Diagnosis not present

## 2021-02-02 DIAGNOSIS — M1990 Unspecified osteoarthritis, unspecified site: Secondary | ICD-10-CM | POA: Diagnosis not present

## 2021-02-02 DIAGNOSIS — E782 Mixed hyperlipidemia: Secondary | ICD-10-CM | POA: Diagnosis not present

## 2021-02-02 DIAGNOSIS — R7303 Prediabetes: Secondary | ICD-10-CM | POA: Diagnosis not present

## 2021-02-02 DIAGNOSIS — I340 Nonrheumatic mitral (valve) insufficiency: Secondary | ICD-10-CM | POA: Diagnosis not present

## 2021-03-02 DIAGNOSIS — I10 Essential (primary) hypertension: Secondary | ICD-10-CM | POA: Diagnosis not present

## 2021-03-02 DIAGNOSIS — I340 Nonrheumatic mitral (valve) insufficiency: Secondary | ICD-10-CM | POA: Diagnosis not present

## 2021-03-02 DIAGNOSIS — I351 Nonrheumatic aortic (valve) insufficiency: Secondary | ICD-10-CM | POA: Diagnosis not present

## 2021-03-02 DIAGNOSIS — I509 Heart failure, unspecified: Secondary | ICD-10-CM | POA: Diagnosis not present

## 2021-04-26 DIAGNOSIS — N12 Tubulo-interstitial nephritis, not specified as acute or chronic: Secondary | ICD-10-CM

## 2021-04-26 HISTORY — DX: Tubulo-interstitial nephritis, not specified as acute or chronic: N12

## 2021-05-15 DIAGNOSIS — I10 Essential (primary) hypertension: Secondary | ICD-10-CM | POA: Diagnosis not present

## 2021-05-15 DIAGNOSIS — R7303 Prediabetes: Secondary | ICD-10-CM | POA: Diagnosis not present

## 2021-05-15 DIAGNOSIS — E782 Mixed hyperlipidemia: Secondary | ICD-10-CM | POA: Diagnosis not present

## 2021-05-18 DIAGNOSIS — M1990 Unspecified osteoarthritis, unspecified site: Secondary | ICD-10-CM | POA: Diagnosis not present

## 2021-05-18 DIAGNOSIS — J069 Acute upper respiratory infection, unspecified: Secondary | ICD-10-CM | POA: Diagnosis not present

## 2021-05-18 DIAGNOSIS — R7303 Prediabetes: Secondary | ICD-10-CM | POA: Diagnosis not present

## 2021-05-18 DIAGNOSIS — R0600 Dyspnea, unspecified: Secondary | ICD-10-CM | POA: Diagnosis not present

## 2021-05-18 DIAGNOSIS — I351 Nonrheumatic aortic (valve) insufficiency: Secondary | ICD-10-CM | POA: Diagnosis not present

## 2021-05-25 DIAGNOSIS — I10 Essential (primary) hypertension: Secondary | ICD-10-CM | POA: Diagnosis not present

## 2021-05-25 DIAGNOSIS — I340 Nonrheumatic mitral (valve) insufficiency: Secondary | ICD-10-CM | POA: Diagnosis not present

## 2021-05-25 DIAGNOSIS — M1990 Unspecified osteoarthritis, unspecified site: Secondary | ICD-10-CM | POA: Diagnosis not present

## 2021-05-25 DIAGNOSIS — E782 Mixed hyperlipidemia: Secondary | ICD-10-CM | POA: Diagnosis not present

## 2021-05-25 DIAGNOSIS — J309 Allergic rhinitis, unspecified: Secondary | ICD-10-CM | POA: Diagnosis not present

## 2021-05-25 DIAGNOSIS — R7303 Prediabetes: Secondary | ICD-10-CM | POA: Diagnosis not present

## 2021-06-08 DIAGNOSIS — I10 Essential (primary) hypertension: Secondary | ICD-10-CM | POA: Diagnosis not present

## 2021-06-08 DIAGNOSIS — I340 Nonrheumatic mitral (valve) insufficiency: Secondary | ICD-10-CM | POA: Diagnosis not present

## 2021-06-08 DIAGNOSIS — R0602 Shortness of breath: Secondary | ICD-10-CM | POA: Diagnosis not present

## 2021-06-08 DIAGNOSIS — I509 Heart failure, unspecified: Secondary | ICD-10-CM | POA: Diagnosis not present

## 2021-07-06 DIAGNOSIS — I509 Heart failure, unspecified: Secondary | ICD-10-CM | POA: Diagnosis not present

## 2021-07-10 DIAGNOSIS — I509 Heart failure, unspecified: Secondary | ICD-10-CM | POA: Diagnosis not present

## 2021-07-10 DIAGNOSIS — E782 Mixed hyperlipidemia: Secondary | ICD-10-CM | POA: Diagnosis not present

## 2021-07-10 DIAGNOSIS — I10 Essential (primary) hypertension: Secondary | ICD-10-CM | POA: Diagnosis not present

## 2021-07-10 DIAGNOSIS — I340 Nonrheumatic mitral (valve) insufficiency: Secondary | ICD-10-CM | POA: Diagnosis not present

## 2021-07-10 DIAGNOSIS — R0602 Shortness of breath: Secondary | ICD-10-CM | POA: Diagnosis not present

## 2021-07-20 DIAGNOSIS — R0602 Shortness of breath: Secondary | ICD-10-CM | POA: Diagnosis not present

## 2021-07-24 DIAGNOSIS — E782 Mixed hyperlipidemia: Secondary | ICD-10-CM | POA: Diagnosis not present

## 2021-07-24 DIAGNOSIS — I509 Heart failure, unspecified: Secondary | ICD-10-CM | POA: Diagnosis not present

## 2021-07-24 DIAGNOSIS — I10 Essential (primary) hypertension: Secondary | ICD-10-CM | POA: Diagnosis not present

## 2021-07-24 DIAGNOSIS — I340 Nonrheumatic mitral (valve) insufficiency: Secondary | ICD-10-CM | POA: Diagnosis not present

## 2021-09-04 DIAGNOSIS — R7303 Prediabetes: Secondary | ICD-10-CM | POA: Diagnosis not present

## 2021-09-04 DIAGNOSIS — E782 Mixed hyperlipidemia: Secondary | ICD-10-CM | POA: Diagnosis not present

## 2021-09-04 DIAGNOSIS — I10 Essential (primary) hypertension: Secondary | ICD-10-CM | POA: Diagnosis not present

## 2021-10-15 ENCOUNTER — Ambulatory Visit: Payer: Medicare Other | Admitting: Urology

## 2021-11-23 DIAGNOSIS — R0602 Shortness of breath: Secondary | ICD-10-CM | POA: Diagnosis not present

## 2021-11-23 DIAGNOSIS — I509 Heart failure, unspecified: Secondary | ICD-10-CM | POA: Diagnosis not present

## 2021-11-23 DIAGNOSIS — I340 Nonrheumatic mitral (valve) insufficiency: Secondary | ICD-10-CM | POA: Diagnosis not present

## 2021-11-23 DIAGNOSIS — E782 Mixed hyperlipidemia: Secondary | ICD-10-CM | POA: Diagnosis not present

## 2021-11-23 DIAGNOSIS — I10 Essential (primary) hypertension: Secondary | ICD-10-CM | POA: Diagnosis not present

## 2021-12-21 DIAGNOSIS — E782 Mixed hyperlipidemia: Secondary | ICD-10-CM | POA: Diagnosis not present

## 2021-12-21 DIAGNOSIS — R7303 Prediabetes: Secondary | ICD-10-CM | POA: Diagnosis not present

## 2021-12-21 DIAGNOSIS — I10 Essential (primary) hypertension: Secondary | ICD-10-CM | POA: Diagnosis not present

## 2021-12-21 DIAGNOSIS — E783 Hyperchylomicronemia: Secondary | ICD-10-CM | POA: Diagnosis not present

## 2021-12-25 DIAGNOSIS — R7303 Prediabetes: Secondary | ICD-10-CM | POA: Diagnosis not present

## 2021-12-25 DIAGNOSIS — E782 Mixed hyperlipidemia: Secondary | ICD-10-CM | POA: Diagnosis not present

## 2021-12-25 DIAGNOSIS — M1990 Unspecified osteoarthritis, unspecified site: Secondary | ICD-10-CM | POA: Diagnosis not present

## 2021-12-25 DIAGNOSIS — I340 Nonrheumatic mitral (valve) insufficiency: Secondary | ICD-10-CM | POA: Diagnosis not present

## 2022-01-13 DIAGNOSIS — K921 Melena: Secondary | ICD-10-CM | POA: Diagnosis not present

## 2022-01-15 DIAGNOSIS — R7303 Prediabetes: Secondary | ICD-10-CM | POA: Diagnosis not present

## 2022-01-15 DIAGNOSIS — Z0001 Encounter for general adult medical examination with abnormal findings: Secondary | ICD-10-CM | POA: Diagnosis not present

## 2022-01-15 DIAGNOSIS — K921 Melena: Secondary | ICD-10-CM | POA: Diagnosis not present

## 2022-01-15 DIAGNOSIS — E782 Mixed hyperlipidemia: Secondary | ICD-10-CM | POA: Diagnosis not present

## 2022-02-26 IMAGING — CT CT RENAL STONE PROTOCOL
2 of 4 series · 16 of 46 positions shown, 18 images · non-contrast
Comparison: None

CLINICAL DATA: UTI, LEFT side lower back pain radiating around to
side and to groin, BILATERAL testicular swelling, history Crohn's
disease

EXAM:
CT ABDOMEN AND PELVIS WITHOUT CONTRAST
TECHNIQUE: Multidetector CT imaging of the abdomen and pelvis was performed
following the standard protocol without IV contrast. Sagittal and
coronal MPR images reconstructed from axial data set. No oral
contrast administered

[Series 2: stone full standard · axial · 0.70mm/px · z∈[-1185,-785]mm · 13 of 88 slices shown, 15 images]
[im 4/88  soft-tissue]
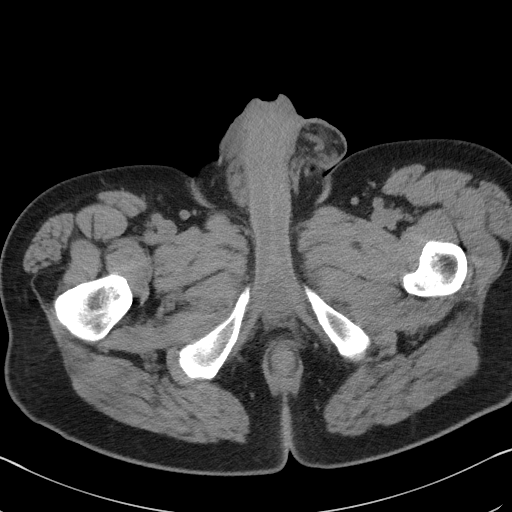
[im 4/88  bone]
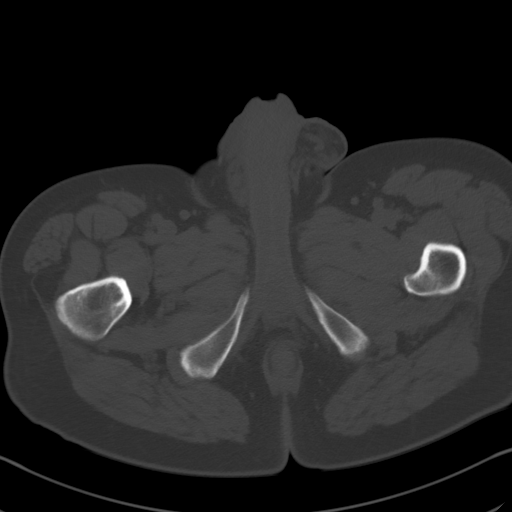
[im 12/88  soft-tissue]
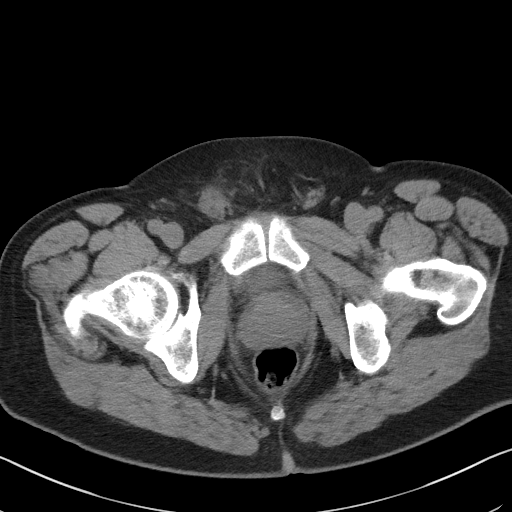
[im 19/88  soft-tissue]
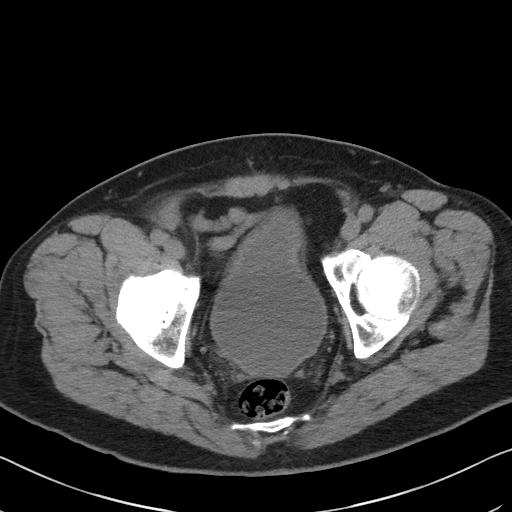
[im 23/88  soft-tissue]
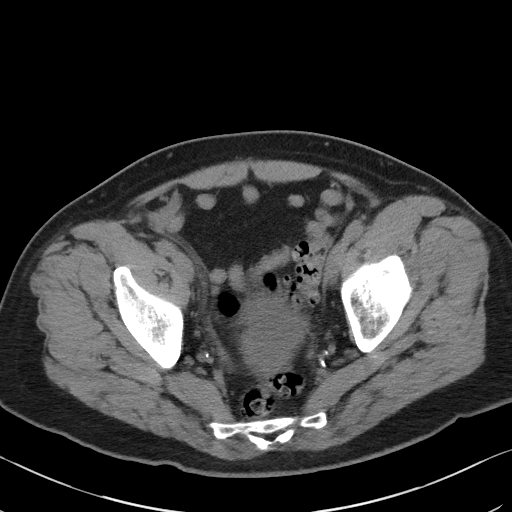
[im 31/88  soft-tissue]
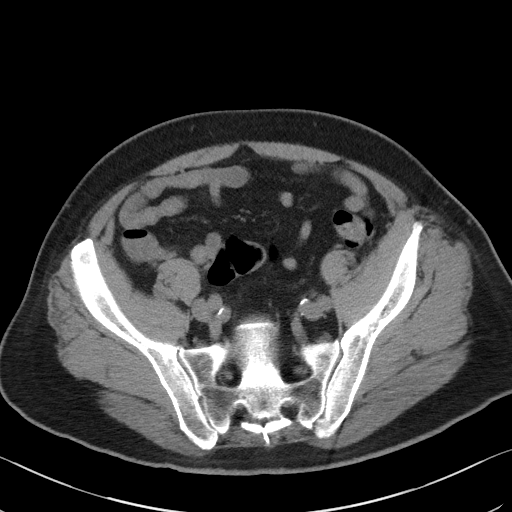
[im 38/88  soft-tissue]
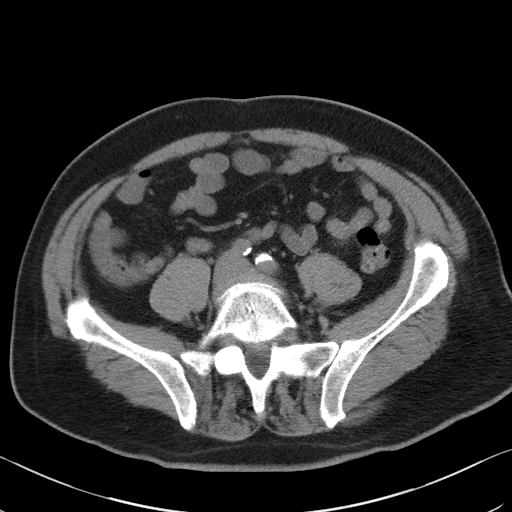
[im 46/88  soft-tissue]
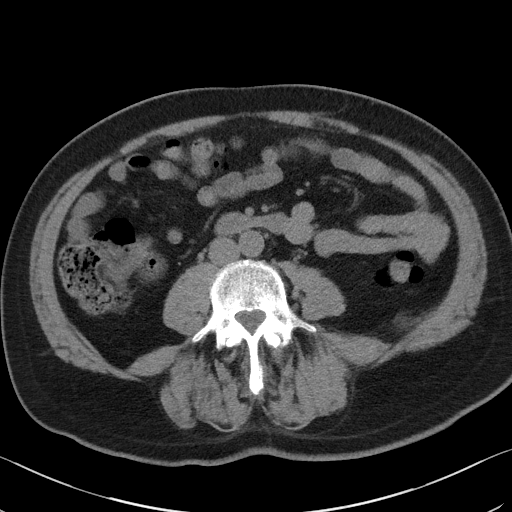
[im 50/88  soft-tissue]
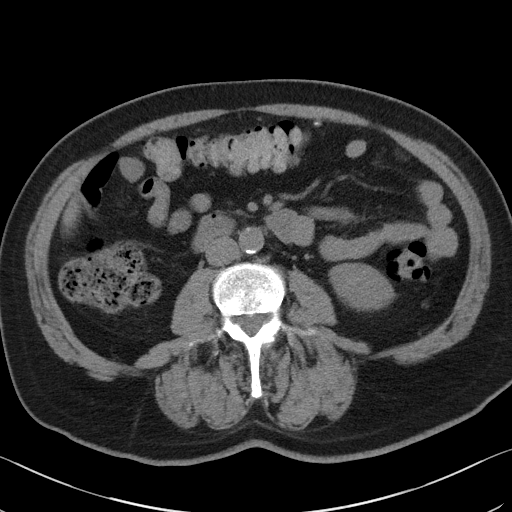
[im 57/88  soft-tissue]
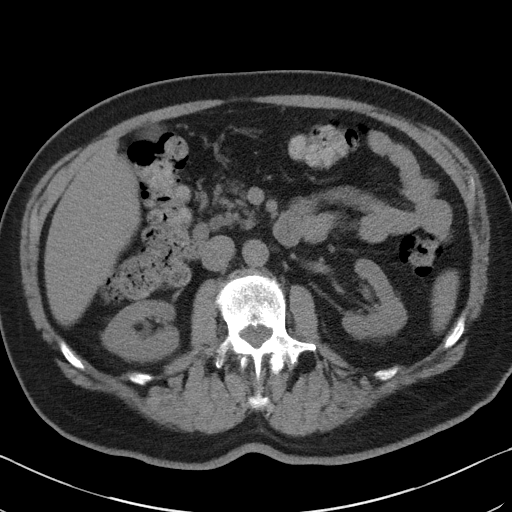
[im 57/88  bone]
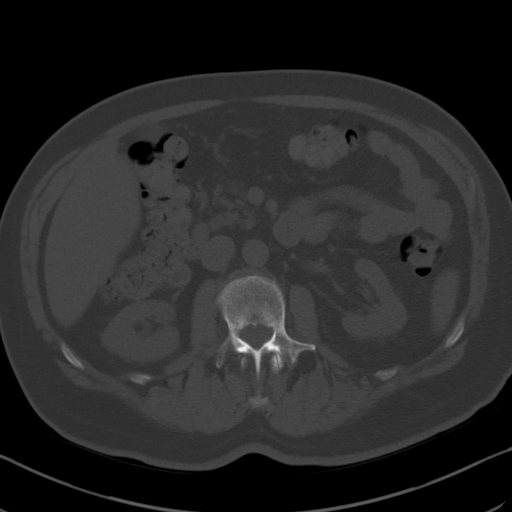
[im 65/88  soft-tissue]
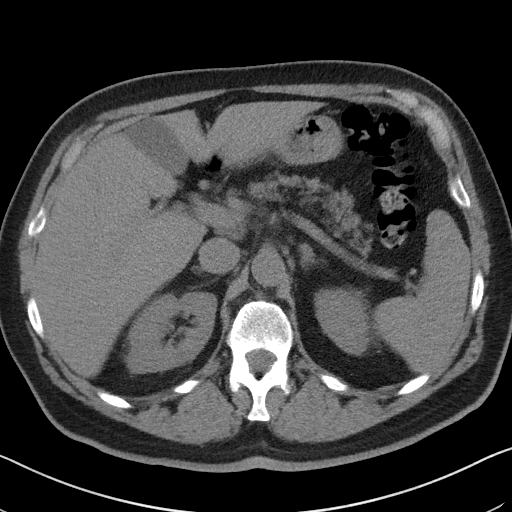
[im 69/88  soft-tissue]
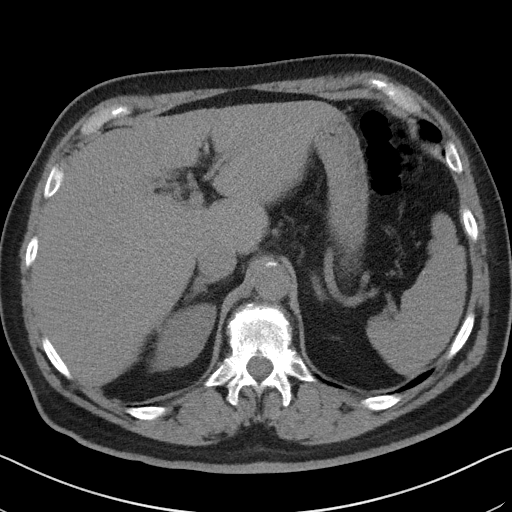
[im 76/88  soft-tissue]
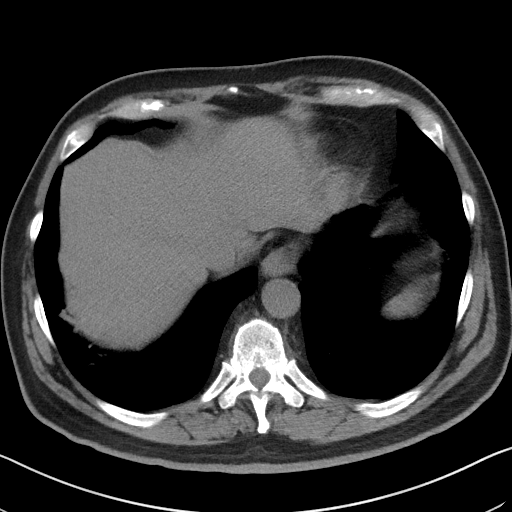
[im 84/88  soft-tissue]
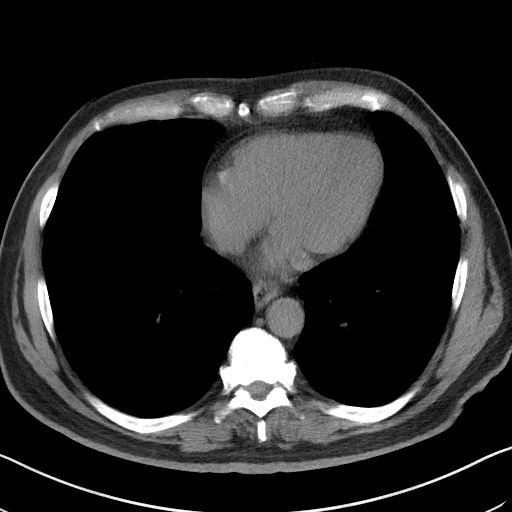

[Series 5: coronal · coronal · 0.79mm/px · 3 of 147 slices shown]
[im 49/147  soft-tissue]
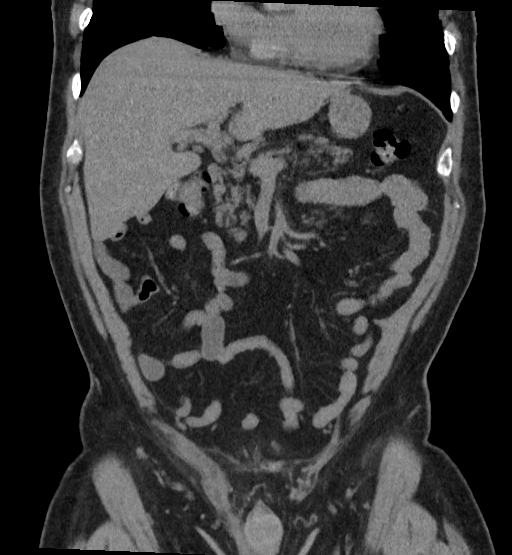
[im 65/147  soft-tissue]
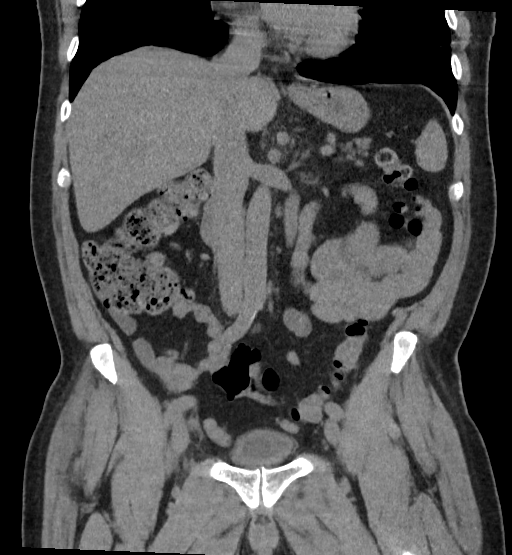
[im 82/147  soft-tissue]
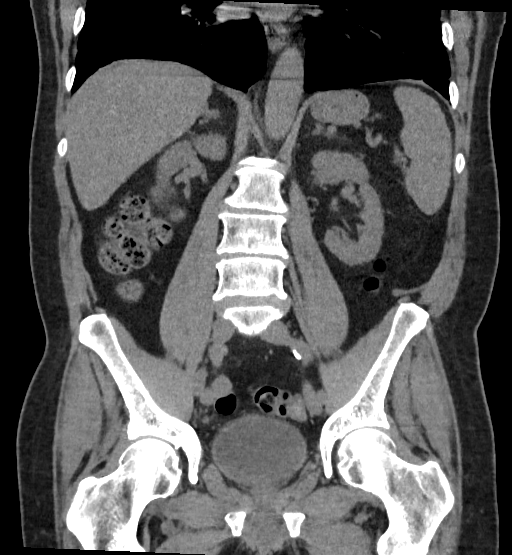

[16 of 46 positions shown; findings below may reference images not displayed]

FINDINGS: Lower chest: Minimal atelectasis or scarring at lung bases

Hepatobiliary: Gallbladder and liver normal appearance

Pancreas: Normal appearance

Spleen: Normal appearance

Adrenals/Urinary Tract: Adrenal glands normal appearance. Kidneys,
ureters, and bladder normal appearance. No urinary tract
calcification or dilatation. No renal masses.

Stomach/Bowel: Appendix not visualized, no pericecal inflammatory
process seen. Stomach decompressed. Mild sigmoid diverticulosis
without evidence of diverticulitis. Remaining bowel loops
unremarkable.

Vascular/Lymphatic: Few pelvic phleboliths. Atherosclerotic
calcifications aorta and iliac arteries without aneurysm. No
adenopathy.

Reproductive: Mild prostatic enlargement, gland measuring 5.0 x
cm.

Other: Small LEFT inguinal hernia containing fat. Mild edema along
the RIGHT spermatic cord without hernia, nonspecific. No free air or
free fluid. Small umbilical hernia containing fat.

Musculoskeletal: Degenerative disc and facet disease changes lumbar
spine. Grade 1 anterolisthesis L4-L5. Mild degenerative changes of
the hip joints.
IMPRESSION: Mild prostatic enlargement.

Small LEFT inguinal and umbilical hernias containing fat.

Mild sigmoid diverticulosis without evidence of diverticulitis.

Mild nonspecific edema is seen along the RIGHT spermatic cord
through the RIGHT inguinal canal, nonspecific; this could reflect
inflammatory process but also could be seen with testicular torsion;
recommend correlation with physical exam and if clinically indicated
scrotal ultrasound with scrotal Doppler.

Aortic Atherosclerosis ([id number]).

## 2022-02-26 IMAGING — CR DG CHEST 1V PORT
1 series · 1 of 1 positions shown · non-contrast
Comparison: Chest x-ray 12/12/2019.

CLINICAL DATA: 69-year-old male with history of cough and shortness
of breath.

EXAM:
PORTABLE CHEST 1 VIEW

[chest ap]
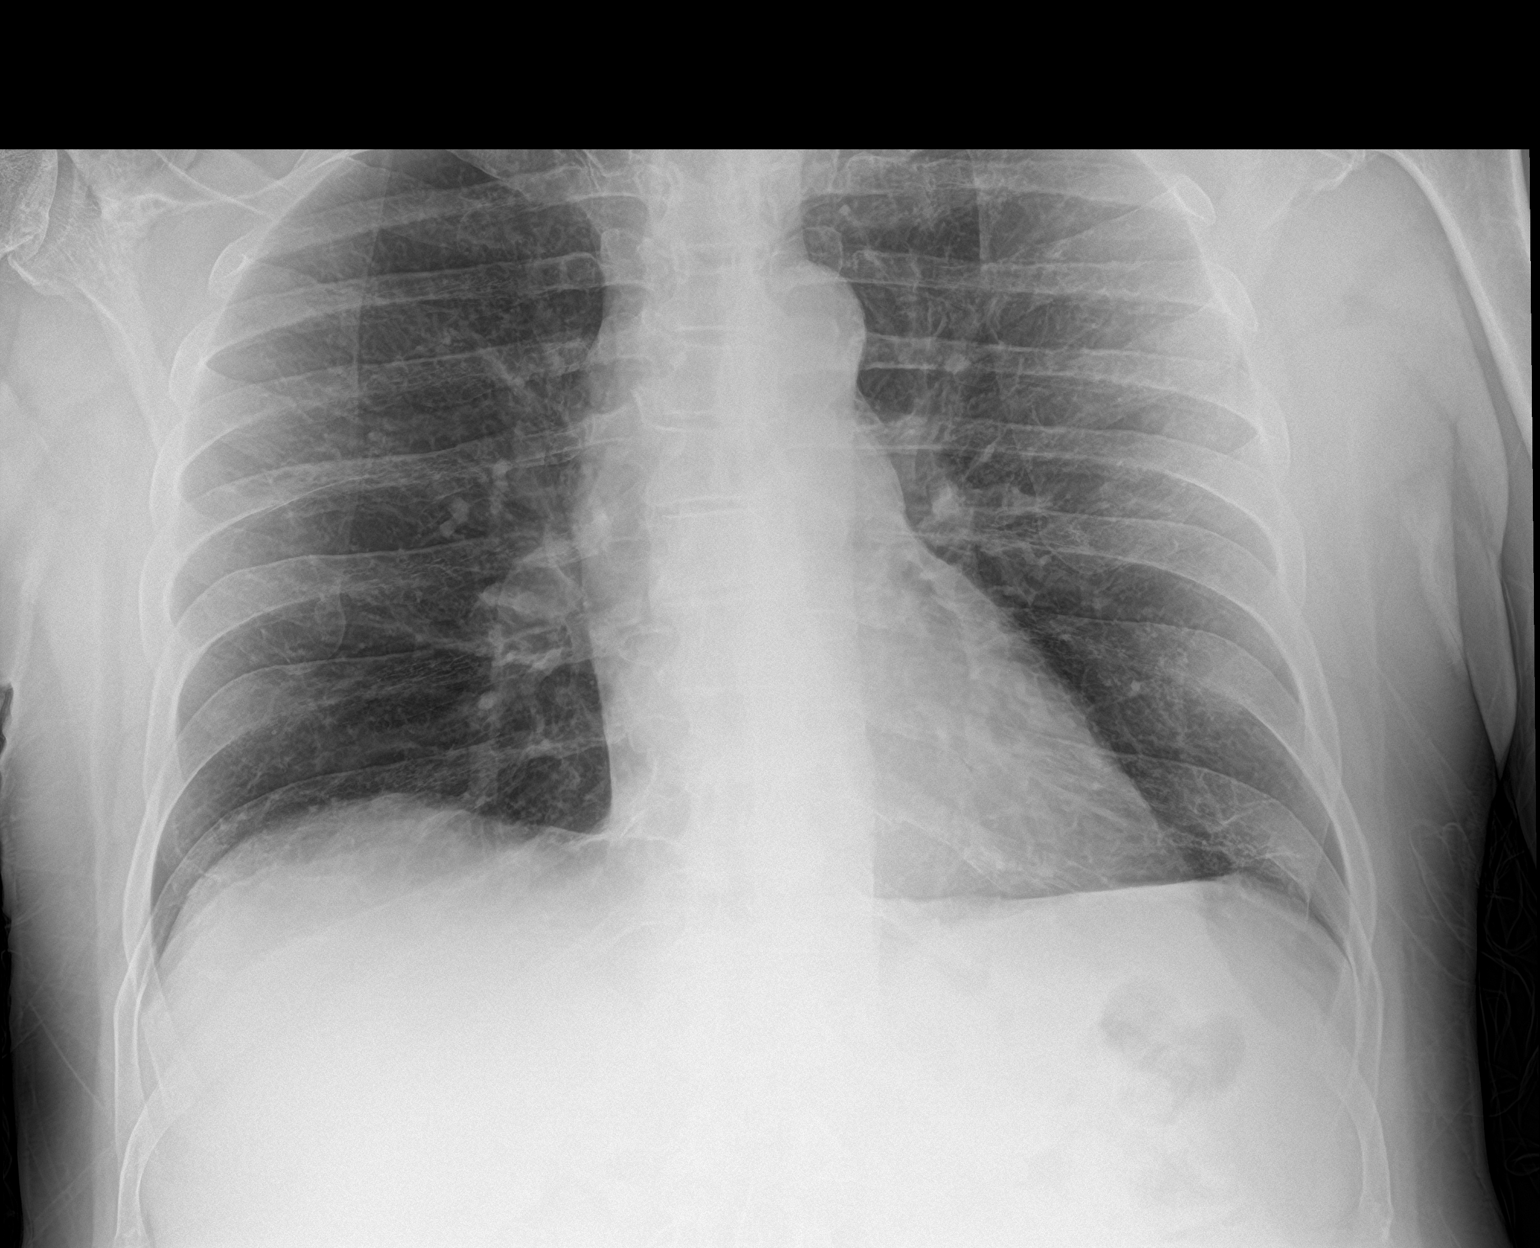

[1 of 1 positions shown; findings below may reference images not displayed]

FINDINGS: Lung volumes are normal. No consolidative airspace disease. No
pleural effusions. No pneumothorax. No pulmonary nodule or mass
noted. Pulmonary vasculature and the cardiomediastinal silhouette
are within normal limits.
IMPRESSION: No radiographic evidence of acute cardiopulmonary disease.

## 2022-03-15 IMAGING — US US SCROTUM W/ DOPPLER COMPLETE
1 series · 13 of 25 positions shown · non-contrast
Comparison: 01/30/2020 and 03/02/2020

CLINICAL DATA: Followup orchitis.

EXAM:
SCROTAL ULTRASOUND
DOPPLER ULTRASOUND OF THE TESTICLES
TECHNIQUE: Complete ultrasound examination of the testicles, epididymis, and
other scrotal structures was performed. Color and spectral Doppler
ultrasound were also utilized to evaluate blood flow to the
testicles.

[Series 1: us scrotum w/ doppler complete · 0.07mm/px · 13 of 56 slices shown]
[im 1/56]
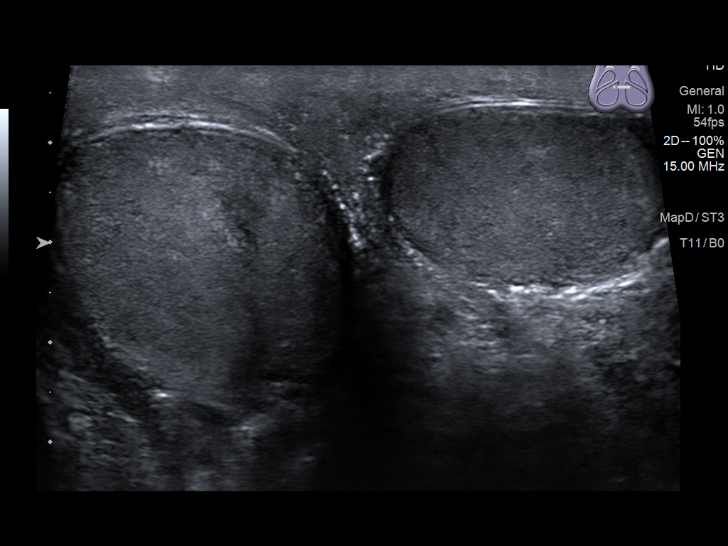
[im 5/56]
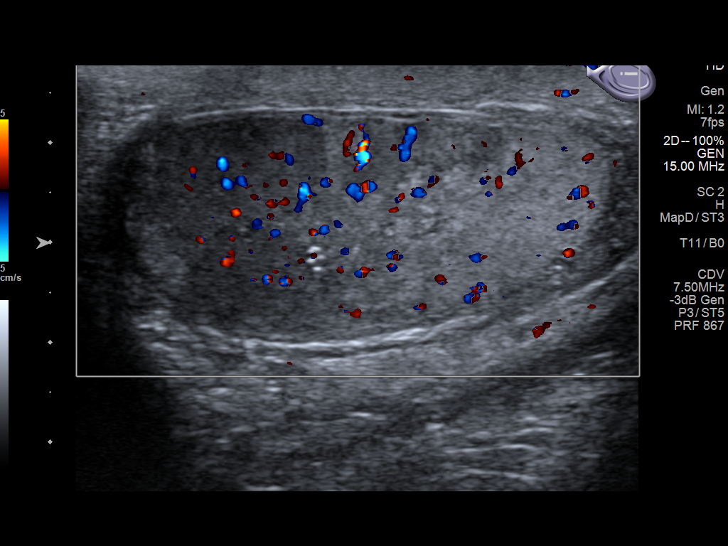
[im 10/56]
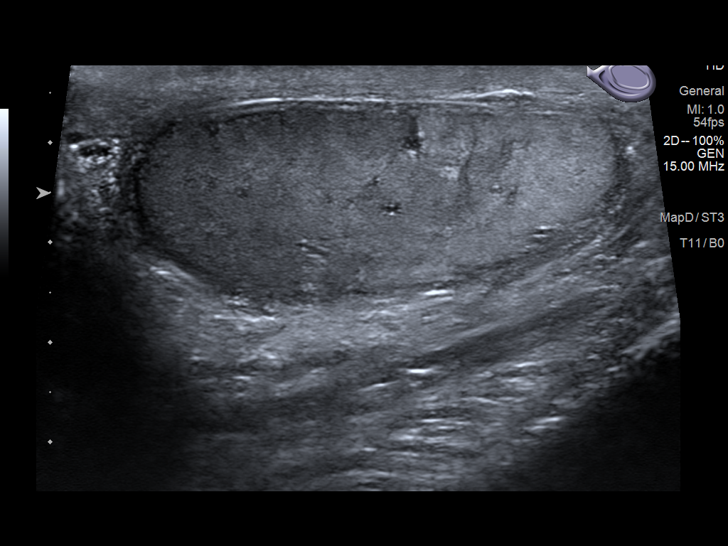
[im 14/56]
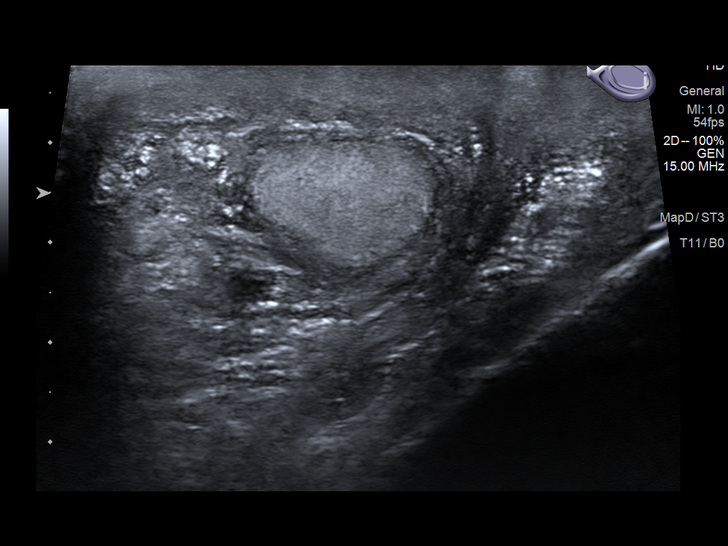
[im 19/56]
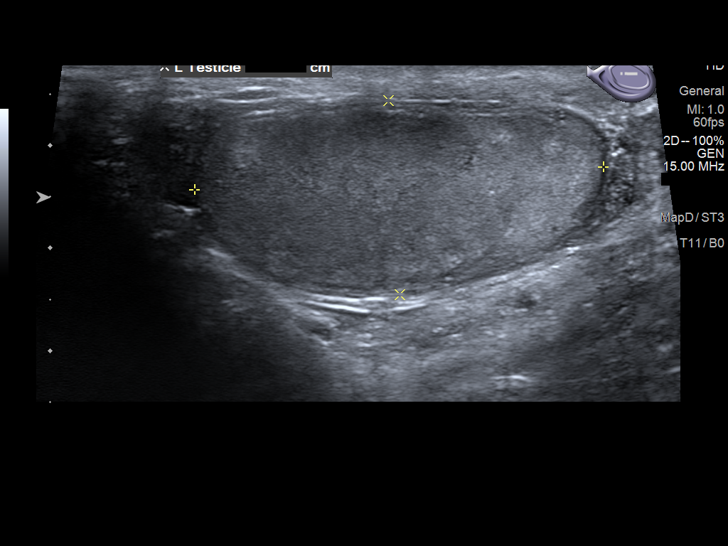
[im 23/56]
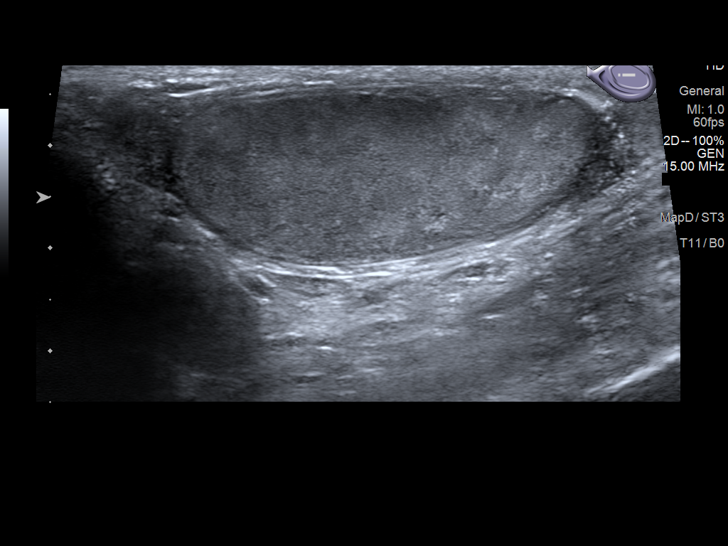
[im 28/56]
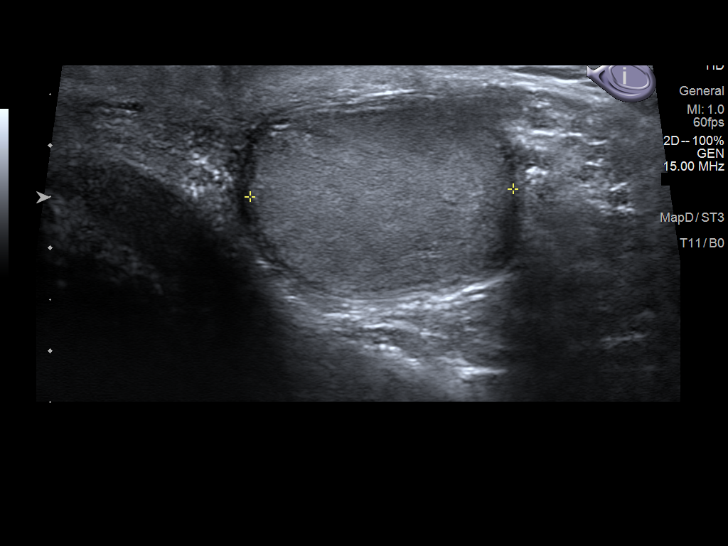
[im 33/56]
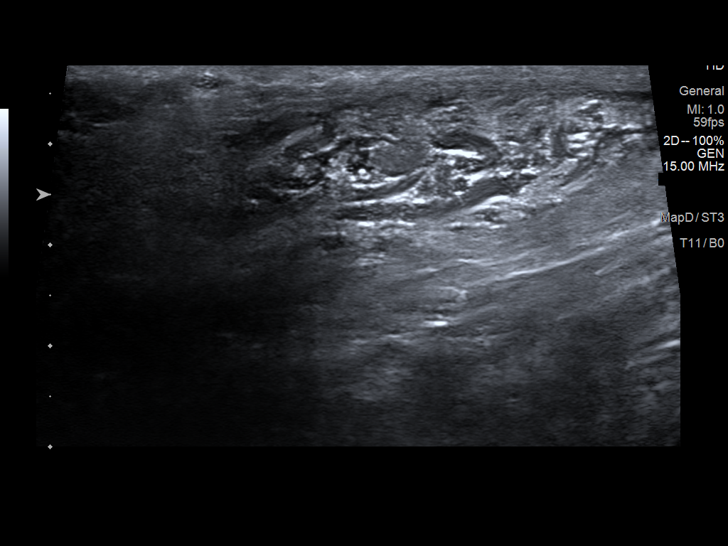
[im 37/56]
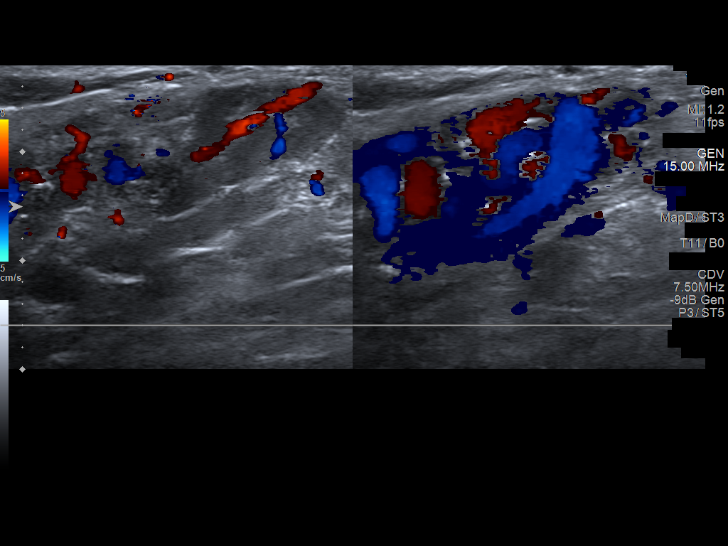
[im 42/56]
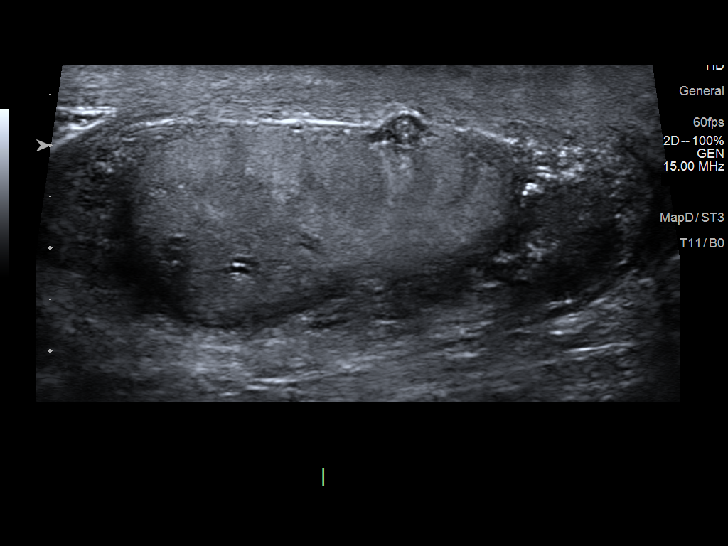
[im 46/56]
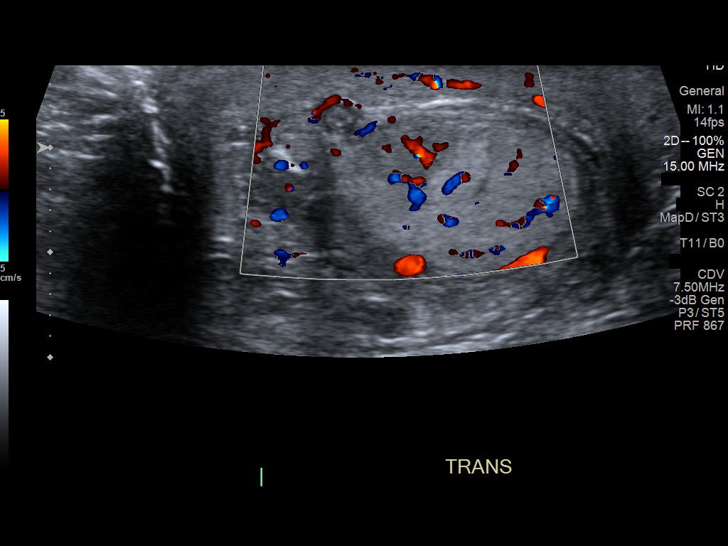
[im 51/56]
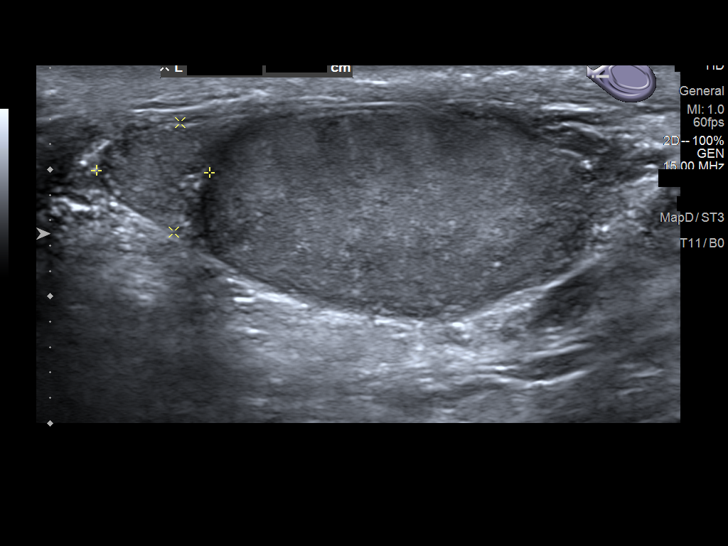
[im 56/56]
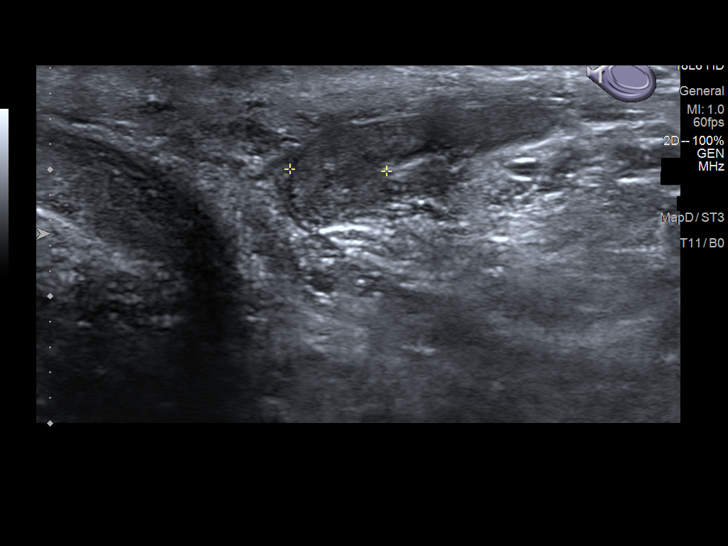

[13 of 25 positions shown; findings below may reference images not displayed]

FINDINGS: Right testicle

Measurements: 4.9 x 2.3 x 3.1 cm. Stable moderately heterogeneous
echotexture without discrete mass. No fluid collections to suggest
an abscess. The testicle is hypervascular and findings suspicious
for orchitis.

There is a small subcapsular but slightly protruding nodule
involving the inferolateral testicle. It measures 3.5 x 3.5 x 4.5 mm
and is probably a small complex tunica albuginea cyst.

Left testicle

Measurements: 4.0 x 1.9 x 2.6 cm. Mildly heterogeneous echotexture
appears stable. No focal lesions.

Right epididymis:  Normal in size and appearance.

Left epididymis:  Normal in size and appearance.

Hydrocele:  None visualized.

Varicocele:  Prominent left-sided varicocele.

Pulsed Doppler interrogation of both testes demonstrates normal low
resistance arterial and venous waveforms bilaterally.
IMPRESSION: 1. Hypervascularity noted in the right testicle suggesting orchitis.
2. Heterogeneous echogenicity of both testicles is a stable finding
and no definite mass is seen.
3. 4.5 mm subcapsular but slightly protruding nodule from the right
testicle is likely a complex tunica albuginea cyst. Follow-up
ultrasound examination in 3-4 months is suggested.
4. Left-sided varicocele.

## 2022-04-12 DIAGNOSIS — Z23 Encounter for immunization: Secondary | ICD-10-CM | POA: Diagnosis not present

## 2022-05-03 DIAGNOSIS — I10 Essential (primary) hypertension: Secondary | ICD-10-CM | POA: Diagnosis not present

## 2022-05-03 DIAGNOSIS — E782 Mixed hyperlipidemia: Secondary | ICD-10-CM | POA: Diagnosis not present

## 2022-05-03 DIAGNOSIS — R7303 Prediabetes: Secondary | ICD-10-CM | POA: Diagnosis not present

## 2022-05-03 DIAGNOSIS — Z0001 Encounter for general adult medical examination with abnormal findings: Secondary | ICD-10-CM | POA: Diagnosis not present

## 2022-05-07 DIAGNOSIS — I340 Nonrheumatic mitral (valve) insufficiency: Secondary | ICD-10-CM | POA: Diagnosis not present

## 2022-05-07 DIAGNOSIS — E782 Mixed hyperlipidemia: Secondary | ICD-10-CM | POA: Diagnosis not present

## 2022-05-07 DIAGNOSIS — I10 Essential (primary) hypertension: Secondary | ICD-10-CM | POA: Diagnosis not present

## 2022-05-07 DIAGNOSIS — R7303 Prediabetes: Secondary | ICD-10-CM | POA: Diagnosis not present

## 2022-05-14 DIAGNOSIS — I10 Essential (primary) hypertension: Secondary | ICD-10-CM | POA: Diagnosis not present

## 2022-05-14 DIAGNOSIS — I509 Heart failure, unspecified: Secondary | ICD-10-CM | POA: Diagnosis not present

## 2022-05-14 DIAGNOSIS — I340 Nonrheumatic mitral (valve) insufficiency: Secondary | ICD-10-CM | POA: Diagnosis not present

## 2022-05-14 DIAGNOSIS — E782 Mixed hyperlipidemia: Secondary | ICD-10-CM | POA: Diagnosis not present

## 2022-06-05 ENCOUNTER — Other Ambulatory Visit: Payer: Self-pay | Admitting: Cardiovascular Disease

## 2022-06-05 DIAGNOSIS — I509 Heart failure, unspecified: Secondary | ICD-10-CM

## 2022-06-11 ENCOUNTER — Other Ambulatory Visit: Payer: Self-pay

## 2022-06-11 MED ORDER — SPIRONOLACTONE 25 MG PO TABS: 25.0000 mg | ORAL_TABLET | Freq: Every day | ORAL | 3 refills | Status: DC

## 2022-06-11 MED ORDER — ROSUVASTATIN CALCIUM 10 MG PO TABS: 10.0000 mg | ORAL_TABLET | Freq: Every day | ORAL | 3 refills | Status: DC

## 2022-06-11 MED ORDER — METOPROLOL SUCCINATE ER 25 MG PO TB24: 25.0000 mg | ORAL_TABLET | Freq: Every day | ORAL | 3 refills | Status: DC

## 2022-06-11 MED ORDER — LOSARTAN POTASSIUM 25 MG PO TABS: 25.0000 mg | ORAL_TABLET | Freq: Every day | ORAL | 3 refills | Status: DC

## 2022-09-06 ENCOUNTER — Other Ambulatory Visit: Payer: Medicare Other

## 2022-09-06 ENCOUNTER — Other Ambulatory Visit: Payer: Self-pay | Admitting: Nurse Practitioner

## 2022-09-06 DIAGNOSIS — R7303 Prediabetes: Secondary | ICD-10-CM | POA: Diagnosis not present

## 2022-09-06 DIAGNOSIS — I10 Essential (primary) hypertension: Secondary | ICD-10-CM | POA: Diagnosis not present

## 2022-09-06 DIAGNOSIS — E782 Mixed hyperlipidemia: Secondary | ICD-10-CM | POA: Diagnosis not present

## 2022-09-07 LAB — LIPID PANEL W/O CHOL/HDL RATIO
Cholesterol, Total: 113 mg/dL (ref 100–199)
HDL: 43 mg/dL (ref >39)
LDL Chol Calc (NIH): 52 mg/dL (ref 0–99)
Triglycerides: 96 mg/dL (ref 0–149)
VLDL Cholesterol Cal: 18 mg/dL (ref 5–40)

## 2022-09-07 LAB — COMPREHENSIVE METABOLIC PANEL
ALT: 18 IU/L (ref 0–44)
AST: 21 IU/L (ref 0–40)
Albumin/Globulin Ratio: 1.8 (ref 1.2–2.2)
Albumin: 4.5 g/dL (ref 3.9–4.9)
Alkaline Phosphatase: 50 IU/L (ref 44–121)
BUN/Creatinine Ratio: 11 (ref 10–24)
BUN: 13 mg/dL (ref 8–27)
Bilirubin Total: 1 mg/dL (ref 0.0–1.2)
CO2: 24 mmol/L (ref 20–29)
Calcium: 10.1 mg/dL (ref 8.6–10.2)
Chloride: 103 mmol/L (ref 96–106)
Creatinine, Ser: 1.18 mg/dL (ref 0.76–1.27)
Globulin, Total: 2.5 g/dL (ref 1.5–4.5)
Glucose: 126 mg/dL — ABNORMAL HIGH (ref 70–99)
Potassium: 4.9 mmol/L (ref 3.5–5.2)
Sodium: 141 mmol/L (ref 134–144)
Total Protein: 7 g/dL (ref 6.0–8.5)
eGFR: 66 mL/min/{1.73_m2} (ref >59)

## 2022-09-07 LAB — HGB A1C W/O EAG: Hgb A1c MFr Bld: 5.9 % — ABNORMAL HIGH (ref 4.8–5.6)

## 2022-09-07 LAB — TSH: TSH: 1.86 u[IU]/mL (ref 0.450–4.500)

## 2022-09-07 LAB — PSA, SERUM (SERIAL MONITOR): Prostate Specific Ag, Serum: 4.3 ng/mL — ABNORMAL HIGH (ref 0.0–4.0)

## 2022-09-10 ENCOUNTER — Ambulatory Visit: Payer: Medicare Other | Admitting: Nurse Practitioner

## 2022-09-13 ENCOUNTER — Other Ambulatory Visit: Payer: Self-pay | Admitting: Internal Medicine

## 2022-09-13 ENCOUNTER — Ambulatory Visit: Payer: Medicare Other | Admitting: Cardiovascular Disease

## 2022-09-13 ENCOUNTER — Encounter: Payer: Self-pay | Admitting: Cardiovascular Disease

## 2022-09-13 VITALS — BP 130/80 | HR 70 | Ht 69.0 in | Wt 202.6 lb

## 2022-09-13 DIAGNOSIS — I5032 Chronic diastolic (congestive) heart failure: Secondary | ICD-10-CM

## 2022-09-13 DIAGNOSIS — R972 Elevated prostate specific antigen [PSA]: Secondary | ICD-10-CM

## 2022-09-13 DIAGNOSIS — I10 Essential (primary) hypertension: Secondary | ICD-10-CM

## 2022-09-13 DIAGNOSIS — I2510 Atherosclerotic heart disease of native coronary artery without angina pectoris: Secondary | ICD-10-CM | POA: Diagnosis not present

## 2022-09-13 NOTE — Assessment & Plan Note (Signed)
04/2015 1 - Calcium score is 57.6.  2 - Right dominant system.  3 - Minor luminor irregularities in mid LAD.  Normal LCX and RCA.  Treat medically.

## 2022-09-13 NOTE — Progress Notes (Signed)
Cardiology Office Note   Date:  09/13/2022   ID:  Douglas Humphrey, DOB [birth year], MRN [id number]  PCP:  Douglas Eva, NP  Cardiologist:  Douglas Blackwater, MD      History of Present Illness: Douglas Humphrey is a 72 y.o. male who presents for  Chief Complaint  Patient presents with   Follow-up    4 month follow up    Patient in office for routine cardiac exam. Denies chest pain, shortness of breath, edema, palpitations.      Past Medical History:  Diagnosis Date   Chronic back pain    Crohn's disease (HCC)    Leaky heart valve      Past Surgical History:  Procedure Laterality Date   NO PAST SURGERIES       Current Outpatient Medications  Medication Sig Dispense Refill   losartan (COZAAR) 25 MG tablet TAKE 1 TABLET BY MOUTH EVERY DAY 90 tablet 2   metoprolol succinate (TOPROL-XL) 25 MG 24 hr tablet Take 1 tablet (25 mg total) by mouth daily. 90 tablet 3   Multiple Vitamin (MULTIVITAMIN WITH MINERALS) TABS tablet Take 1 tablet by mouth at bedtime.     polycarbophil (FIBERCON) 625 MG tablet Take 625 mg by mouth at bedtime.     rosuvastatin (CRESTOR) 10 MG tablet Take 1 tablet (10 mg total) by mouth daily. 90 tablet 3   spironolactone (ALDACTONE) 25 MG tablet Take 1 tablet (25 mg total) by mouth daily. 90 tablet 3   tamsulosin (FLOMAX) 0.4 MG CAPS capsule Take 1 capsule (0.4 mg total) by mouth daily. 30 capsule 11   No current facility-administered medications for this visit.    Allergies:   Patient has no known allergies.    Social History:   reports that he has never smoked. He has never used smokeless tobacco. He reports that he does not drink alcohol and does not use drugs.   Family History:  family history includes CAD in his father and mother; COPD in his mother.    ROS:     Review of Systems  Constitutional: Negative.   HENT: Negative.    Eyes: Negative.   Respiratory: Negative.    Cardiovascular: Negative.   Gastrointestinal: Negative.    Genitourinary: Negative.   Musculoskeletal: Negative.   Skin: Negative.   Neurological: Negative.   Endo/Heme/Allergies: Negative.   Psychiatric/Behavioral: Negative.    All other systems reviewed and are negative.    All other systems are reviewed and negative.    PHYSICAL EXAM: VS:  BP 130/80   Pulse 70   Ht 5\' 9"  (1.753 m)   Wt 202 lb 9.6 oz (91.9 kg)   SpO2 96%   BMI 29.92 kg/m  , BMI Body mass index is 29.92 kg/m. Last weight:  Wt Readings from Last 3 Encounters:  09/13/22 202 lb 9.6 oz (91.9 kg)  10/15/20 198 lb (89.8 kg)  05/07/20 207 lb 6.4 oz (94.1 kg)     Physical Exam Vitals reviewed.  Constitutional:      Appearance: Normal appearance. He is normal weight.  HENT:     Head: Normocephalic.     Nose: Nose normal.     Mouth/Throat:     Mouth: Mucous membranes are moist.  Eyes:     Pupils: Pupils are equal, round, and reactive to light.  Cardiovascular:     Rate and Rhythm: Normal rate and regular rhythm.     Pulses: Normal pulses.     Heart sounds: Normal heart  sounds.  Pulmonary:     Effort: Pulmonary effort is normal.  Abdominal:     General: Abdomen is flat. Bowel sounds are normal.  Musculoskeletal:        General: Normal range of motion.     Cervical back: Normal range of motion.  Skin:    General: Skin is warm.  Neurological:     General: No focal deficit present.     Mental Status: He is alert.  Psychiatric:        Mood and Affect: Mood normal.      EKG: none today  Recent Labs: 09/06/2022: ALT 18; BUN 13; Creatinine, Ser 1.18; Potassium 4.9; Sodium 141; TSH 1.860    Lipid Panel    Component Value Date/Time   CHOL 113 09/06/2022 0923   TRIG 96 09/06/2022 0923   HDL 43 09/06/2022 0923   CHOLHDL 4.7 05/15/2015 0527   VLDL 30 05/15/2015 0527   LDLCALC 52 09/06/2022 0923      Other studies Reviewed: Patient: [id number] - Douglas Humphrey DOB:  05-19-1950  Date:  07/20/2021 07:30 Provider: Adrian Blackwater MD Encounter: [facility]                                                                                        [facility] [address] [place], [place] [address] [phone] STUDY:  Gated Stress / Rest Myocardial Perfusion Imaging Tomographic (SPECT) Including attenuation correction Wall Motion, Left Ventricular Ejection Fraction By Gated Technique.Treadmill Stress Test. SEX: Male   WEIGHT: 212 lbs   HEIGHT: 69 in      ARMS UP: YES/NO                                                                        REFERRING PHYSICIAN: Dr.Eriel Doyon Welton Humphrey  INDICATION FOR STUDY: SOB                                                                                                                                                                                                                     TECHNIQUE:  Approximately 20 minutes following the intravenous administration of 10.3 mCi of Tc-87m Sestamibi after stress testing in a reclined supine position with arms above their head if able to do so, gated SPECT imaging of the heart was performed. After about a 2hr break, the patient was injected intravenously with 32.6 mCi of Tc-48m Sestamibi.  Approximately 45 minutes later in the same position as stress imaging SPECT rest imaging of the heart was performed.  STRESS BY:  Douglas Blackwater, MD PROTOCOL:   Smitty Cords                                                                                       MAX PRED HR: 151                     85%: 128               75%:  113                                                                                                                  RESTING BP: 116/70   RESTING HR: 78  PEAK BP: 144/80  PEAK HR: 117 (77%)  EXERCISE DURATION:  3:00                                            METS: 4.0     REASON FOR TEST TERMINATION: Fatigue. SOB.                                                                                                                                  SYMPTOMS: Fatigue. SOB.  DUKE TREADMILL SCORE:  3                                      RISK:  Moderate                                                                                                                                                                                                           EKG RESULTS: NSR. 82/min. Low voltage. No significant ST changes at peak exercise.                                                              IMAGE QUALITY: Good  PERFUSION/WALL MOTION FINDINGS: EF = 72%. No perfusion defects, normal wall motion.                                                                           IMPRESSION: Normal stress test with normal LVEF.                                                                                                                                                                                                                                                                                         Douglas Blackwater, MD Stress Interpreting Physician / Nuclear Interpreting Physician  Douglas Blackwater MD  Electronically signed by: Douglas Humphrey     Date: 07/23/2021 09:11  Patient: [id number] - Ronald L. Reaser DOB:  1950/09/28  Date:  07/06/2021 11:30 Provider: Adrian Blackwater MD Encounter: ECHO   Page 2 REASON FOR VISIT  Visit for: Echocardiogram/I 50.9  Sex:   Male   wt=210    lbs.  BP=120/76   Height=69    inches.   TESTS  Imaging: Echocardiogram:  An echocardiogram in (2-d) mode was performed and in Doppler mode with color flow velocity mapping was performed. The aortic valve cusps are abnormal 2.4    cm, flow velocity 1.39   m/s, and systolic calculated mean flow gradient 5  mmHg. Mitral valve diastolic peak flow velocity E .696    m/s and E/A ratio 0.9. Aortic root diameter 3.5 cm. The LVOT internal diameter 3.4  cm and flow velocity was abnormal 1.31   m/s. LV systolic dimension 2.16   cm, diastolic 3.84   cm, posterior wall thickness 1.72    cm, fractional shortening 43.7  %, and EF 75.6  %. IVS thickness 1.4   cm. LA dimension 4.3 cm. Mitral Valve has Trace Regurgitation. Pulmonic Valve has Trace Regurgitation. Tricuspid Valve has Trace Regurgitation.  ASSESSMENT  Technically adequate study.  Normal chamber sizes.  Normal left ventricular systolic function.  Mild left ventricular hypertrophy with GRADE 1 (relaxation abnormality) diastolic dysfunction.  Normal right ventricular systolic function.  Normal right ventricular diastolic function.  Normal left ventricular wall motion.  Normal right ventricular wall motion.  Trace pulmonary regurgitation.  Trace tricuspid regurgitation.  Normal pulmonary artery pressure.  Trace mitral regurgitation.  No pericardial effusion.  Mildly dilated Left atrium   Mild LVH.    THERAPY   Referring physician: Laurier Nancy  Sonographer: Douglas Humphrey.   Douglas Blackwater MD  Electronically signed by: Douglas Humphrey     Date: 07/06/2021 14:06  Patient: [id number] - Edelmiro L. Rutkoski DOB:  December 03, 1950  Date:  05/22/2015 13:00 Provider: Adrian Blackwater MD Encounter: ALL ANGIOGRAMS (CTA BRAIN, CAROTIDS, RENAL ARTERIES, PE)   Page 1 REASON FOR VISIT  Referred by Douglas Humphrey.    TESTS  Imaging: Computed Tomographic Angiography:  Cardiac multidetector CT was performed paying particular attention to the coronary arteries for the  diagnosis of: Chest pain. Diagnostic Drugs:  Administered iohexol (Omnipaque) through an antecubital vein and images from the examination were analyzed for the presence and extent of coronary artery disease, using 3D image processing software. 100 mL of non-ionic contrast (Omnipaque) was used.     TEST CONCLUSIONS  1 - Calcium score is 57.6.  2 - Right dominant system.  3 - Minor luminor irregularities in mid LAD.  Normal LCX and RCA.  Treat medically.   Douglas Blackwater MD  Electronically signed by: Douglas Humphrey     Date: 05/23/2015 13:31   ASSESSMENT AND PLAN:    ICD-10-CM   1. Diastolic CHF, chronic (HCC)  I50.32     2. Essential hypertension  I10     3. Coronary artery disease involving native coronary artery of native heart without angina pectoris  I25.10        Problem List Items Addressed This Visit       Cardiovascular and Mediastinum   Diastolic CHF, chronic (HCC) - Primary (Chronic)    06/2021 grade I DD      Essential hypertension (Chronic)    Well controlled. EF on 06/2021 echo 75%. Reports shortness of breath improved since losing 10 lbs.       Coronary artery disease involving native coronary artery of native heart without angina pectoris    04/2015 1 - Calcium score is 57.6.  2 - Right dominant system.  3 - Minor luminor irregularities in mid LAD.  Normal LCX and RCA.  Treat medically.        Disposition:   Return in about 4 months (around 01/14/2023).    Total time spent: 30 minutes  Signed,  Douglas Blackwater, MD  09/13/2022 9:29 AM    [facility]

## 2022-09-13 NOTE — Assessment & Plan Note (Signed)
Well controlled. EF on 06/2021 echo 75%. Reports shortness of breath improved since losing 10 lbs.

## 2022-09-13 NOTE — Assessment & Plan Note (Signed)
06/2021 grade I DD

## 2022-09-14 NOTE — Progress Notes (Signed)
Patient notified

## 2022-09-17 ENCOUNTER — Ambulatory Visit (INDEPENDENT_AMBULATORY_CARE_PROVIDER_SITE_OTHER): Payer: Medicare Other | Admitting: Nurse Practitioner

## 2022-09-17 VITALS — BP 142/82 | HR 88 | Ht 69.0 in | Wt 197.0 lb

## 2022-09-17 DIAGNOSIS — R7303 Prediabetes: Secondary | ICD-10-CM | POA: Diagnosis not present

## 2022-09-17 DIAGNOSIS — R972 Elevated prostate specific antigen [PSA]: Secondary | ICD-10-CM | POA: Diagnosis not present

## 2022-09-17 DIAGNOSIS — I10 Essential (primary) hypertension: Secondary | ICD-10-CM | POA: Diagnosis not present

## 2022-09-17 DIAGNOSIS — J069 Acute upper respiratory infection, unspecified: Secondary | ICD-10-CM

## 2022-09-17 MED ORDER — AMOXICILLIN-POT CLAVULANATE 875-125 MG PO TABS: 1.0000 | ORAL_TABLET | Freq: Two times a day (BID) | ORAL | 0 refills | Status: DC

## 2022-09-17 MED ORDER — PREDNISONE 50 MG PO TABS: ORAL_TABLET | ORAL | 0 refills | Status: DC

## 2022-09-17 NOTE — Progress Notes (Signed)
Established Patient Office Visit  Subjective:  Patient ID: Douglas Humphrey, male    DOB: 1950/09/07  Age: 72 y.o. MRN: [id number]  Chief Complaint  Patient presents with   Follow-up    4 Month Follow up    Follow up appt and review of fasting labs.  PSA is at 4.2 and patient will be discussing with urology.  A1c at 5.9% and LDL is at 59 mg/dl.  Has wheezing and a productive cough x 1 week.      No other concerns at this time.   Past Medical History:  Diagnosis Date   Chronic back pain    Crohn's disease (HCC)    Leaky heart valve     Past Surgical History:  Procedure Laterality Date   NO PAST SURGERIES      Social History   Socioeconomic History   Marital status: Single    Spouse name: Not on file   Number of children: Not on file   Years of education: Not on file   Highest education level: Not on file  Occupational History   Not on file  Tobacco Use   Smoking status: Never   Smokeless tobacco: Never  Vaping Use   Vaping Use: Never used  Substance and Sexual Activity   Alcohol use: No   Drug use: No   Sexual activity: Yes    Birth control/protection: None  Other Topics Concern   Not on file  Social History Narrative   Not on file   Social Determinants of Health   Financial Resource Strain: Not on file  Food Insecurity: Not on file  Transportation Needs: Not on file  Physical Activity: Not on file  Stress: Not on file  Social Connections: Not on file  Intimate Partner Violence: Not on file    Family History  Problem Relation Age of Onset   CAD Mother    COPD Mother    CAD Father     No Known Allergies  Review of Systems  Constitutional: Negative.   HENT: Negative.    Eyes: Negative.   Respiratory:  Positive for cough, sputum production and wheezing.   Cardiovascular: Negative.   Gastrointestinal: Negative.   Genitourinary: Negative.   Musculoskeletal:  Positive for joint pain.  Skin: Negative.   Neurological: Negative.    Endo/Heme/Allergies: Negative.   Psychiatric/Behavioral: Negative.         Objective:   BP (!) 142/82   Pulse 88   Ht 5\' 9"  (1.753 m)   Wt 197 lb (89.4 kg)   SpO2 98%   BMI 29.09 kg/m   Vitals:   09/17/22 0914  BP: (!) 142/82  Pulse: 88  Height: 5\' 9"  (1.753 m)  Weight: 197 lb (89.4 kg)  SpO2: 98%  BMI (Calculated): 29.08    Physical Exam Vitals reviewed.  Constitutional:      Appearance: Normal appearance.  HENT:     Head: Normocephalic.     Nose: Congestion present.     Mouth/Throat:     Mouth: Mucous membranes are dry.  Eyes:     Pupils: Pupils are equal, round, and reactive to light.  Cardiovascular:     Rate and Rhythm: Normal rate and regular rhythm.  Pulmonary:     Breath sounds: Wheezing and rhonchi present.  Abdominal:     General: Bowel sounds are normal.     Palpations: Abdomen is soft.  Musculoskeletal:        General: Tenderness present.     Cervical  back: Normal range of motion and neck supple.  Skin:    General: Skin is warm and dry.  Neurological:     Mental Status: He is alert and oriented to person, place, and time.  Psychiatric:        Mood and Affect: Mood normal.        Behavior: Behavior normal.      No results found for any visits on 09/17/22.  Recent Results (from the past 2160 hour(s))  Comprehensive metabolic panel     Status: Abnormal   Collection Time: 09/06/22  9:23 AM  Result Value Ref Range   Glucose 126 (H) 70 - 99 mg/dL   BUN 13 8 - 27 mg/dL   Creatinine, Ser 6.21 0.76 - 1.27 mg/dL   eGFR 66 >30 QM/VHQ/4.69   BUN/Creatinine Ratio 11 10 - 24   Sodium 141 134 - 144 mmol/L   Potassium 4.9 3.5 - 5.2 mmol/L   Chloride 103 96 - 106 mmol/L   CO2 24 20 - 29 mmol/L   Calcium 10.1 8.6 - 10.2 mg/dL   Total Protein 7.0 6.0 - 8.5 g/dL   Albumin 4.5 3.9 - 4.9 g/dL   Globulin, Total 2.5 1.5 - 4.5 g/dL   Albumin/Globulin Ratio 1.8 1.2 - 2.2   Bilirubin Total 1.0 0.0 - 1.2 mg/dL   Alkaline Phosphatase 50 44 - 121 IU/L    AST 21 0 - 40 IU/L   ALT 18 0 - 44 IU/L  Lipid Panel w/o Chol/HDL Ratio     Status: None   Collection Time: 09/06/22  9:23 AM  Result Value Ref Range   Cholesterol, Total 113 100 - 199 mg/dL   Triglycerides 96 0 - 149 mg/dL   HDL 43 >62 mg/dL   VLDL Cholesterol Cal 18 5 - 40 mg/dL   LDL Chol Calc (NIH) 52 0 - 99 mg/dL  PSA, SERUM (SERIAL MONITOR)     Status: Abnormal   Collection Time: 09/06/22  9:23 AM  Result Value Ref Range   Prostate Specific Ag, Serum 4.3 (H) 0.0 - 4.0 ng/mL    Comment: Roche ECLIA methodology. According to the American Urological Association, Serum PSA should decrease and remain at undetectable levels after radical prostatectomy. The AUA defines biochemical recurrence as an initial PSA value 0.2 ng/mL or greater followed by a subsequent confirmatory PSA value 0.2 ng/mL or greater. Values obtained with different assay methods or kits cannot be used interchangeably. Results cannot be interpreted as absolute evidence of the presence or absence of malignant disease.   Hgb A1c w/o eAG     Status: Abnormal   Collection Time: 09/06/22  9:23 AM  Result Value Ref Range   Hgb A1c MFr Bld 5.9 (H) 4.8 - 5.6 %    Comment:          Prediabetes: 5.7 - 6.4          Diabetes: >6.4          Glycemic control for adults with diabetes: <7.0   TSH     Status: None   Collection Time: 09/06/22  9:23 AM  Result Value Ref Range   TSH 1.860 0.450 - 4.500 uIU/mL      Assessment & Plan:   Problem List Items Addressed This Visit   None   No follow-ups on file.   Total time spent: 35 minutes  Orson Eva, NP  09/17/2022   This document may have been prepared by [facility] Voice Recognition software and as  such may include unintentional dictation errors.

## 2022-09-17 NOTE — Patient Instructions (Signed)
1) No CXR, no pred/Augmentin 2) Follow up appt in 3 months, 2 weeks, fasting labs prior 3) Refer to [facility]

## 2022-12-24 ENCOUNTER — Ambulatory Visit: Payer: Medicare Other | Admitting: Internal Medicine

## 2022-12-31 ENCOUNTER — Ambulatory Visit: Payer: Medicare Other | Admitting: Cardiovascular Disease

## 2022-12-31 ENCOUNTER — Other Ambulatory Visit: Payer: Medicare Other

## 2022-12-31 DIAGNOSIS — R972 Elevated prostate specific antigen [PSA]: Secondary | ICD-10-CM

## 2022-12-31 DIAGNOSIS — R7303 Prediabetes: Secondary | ICD-10-CM | POA: Diagnosis not present

## 2022-12-31 DIAGNOSIS — I10 Essential (primary) hypertension: Secondary | ICD-10-CM

## 2023-01-01 LAB — CMP14+EGFR
ALT: 20 IU/L (ref 0–44)
AST: 22 IU/L (ref 0–40)
Albumin: 4.6 g/dL (ref 3.9–4.9)
Alkaline Phosphatase: 46 IU/L (ref 44–121)
BUN/Creatinine Ratio: 11 (ref 10–24)
BUN: 12 mg/dL (ref 8–27)
Bilirubin Total: 0.8 mg/dL (ref 0.0–1.2)
CO2: 25 mmol/L (ref 20–29)
Calcium: 10.3 mg/dL — ABNORMAL HIGH (ref 8.6–10.2)
Chloride: 104 mmol/L (ref 96–106)
Creatinine, Ser: 1.13 mg/dL (ref 0.76–1.27)
Globulin, Total: 1.8 g/dL (ref 1.5–4.5)
Glucose: 132 mg/dL — ABNORMAL HIGH (ref 70–99)
Potassium: 4.9 mmol/L (ref 3.5–5.2)
Sodium: 142 mmol/L (ref 134–144)
Total Protein: 6.4 g/dL (ref 6.0–8.5)
eGFR: 70 mL/min/{1.73_m2} (ref >59)

## 2023-01-01 LAB — CBC WITH DIFFERENTIAL/PLATELET
Basophils Absolute: 0.1 x10E3/uL (ref 0.0–0.2)
Basos: 1 %
EOS (ABSOLUTE): 0.6 x10E3/uL — ABNORMAL HIGH (ref 0.0–0.4)
Eos: 10 %
Hematocrit: 42.2 % (ref 37.5–51.0)
Hemoglobin: 14.5 g/dL (ref 13.0–17.7)
Immature Grans (Abs): 0 x10E3/uL (ref 0.0–0.1)
Immature Granulocytes: 0 %
Lymphocytes Absolute: 1.7 x10E3/uL (ref 0.7–3.1)
Lymphs: 27 %
MCH: 31.5 pg (ref 26.6–33.0)
MCHC: 34.4 g/dL (ref 31.5–35.7)
MCV: 92 fL (ref 79–97)
Monocytes Absolute: 0.5 x10E3/uL (ref 0.1–0.9)
Monocytes: 8 %
Neutrophils Absolute: 3.3 x10E3/uL (ref 1.4–7.0)
Neutrophils: 54 %
Platelets: 207 x10E3/uL (ref 150–450)
RBC: 4.6 x10E6/uL (ref 4.14–5.80)
RDW: 13 % (ref 11.6–15.4)
WBC: 6.3 x10E3/uL (ref 3.4–10.8)

## 2023-01-01 LAB — LIPID PANEL
Chol/HDL Ratio: 2.6 ratio (ref 0.0–5.0)
Cholesterol, Total: 110 mg/dL (ref 100–199)
HDL: 42 mg/dL (ref >39)
LDL Chol Calc (NIH): 53 mg/dL (ref 0–99)
Triglycerides: 69 mg/dL (ref 0–149)
VLDL Cholesterol Cal: 15 mg/dL (ref 5–40)

## 2023-01-01 LAB — PSA: Prostate Specific Ag, Serum: 3.4 ng/mL (ref 0.0–4.0)

## 2023-01-01 LAB — TSH: TSH: 1.84 u[IU]/mL (ref 0.450–4.500)

## 2023-01-01 LAB — HEMOGLOBIN A1C
Est. average glucose Bld gHb Est-mCnc: 126 mg/dL
Hgb A1c MFr Bld: 6 % — ABNORMAL HIGH (ref 4.8–5.6)

## 2023-01-03 ENCOUNTER — Encounter: Payer: Self-pay | Admitting: Internal Medicine

## 2023-01-03 ENCOUNTER — Ambulatory Visit (INDEPENDENT_AMBULATORY_CARE_PROVIDER_SITE_OTHER): Payer: Medicare Other | Admitting: Cardiology

## 2023-01-03 VITALS — BP 130/80 | HR 80 | Ht 69.0 in | Wt 203.2 lb

## 2023-01-03 DIAGNOSIS — R7303 Prediabetes: Secondary | ICD-10-CM | POA: Diagnosis not present

## 2023-01-03 DIAGNOSIS — I10 Essential (primary) hypertension: Secondary | ICD-10-CM

## 2023-01-03 DIAGNOSIS — E782 Mixed hyperlipidemia: Secondary | ICD-10-CM | POA: Diagnosis not present

## 2023-01-03 MED ORDER — TAMSULOSIN HCL 0.4 MG PO CAPS: 0.4000 mg | ORAL_CAPSULE | Freq: Every day | ORAL | 11 refills | Status: DC

## 2023-01-03 NOTE — Progress Notes (Signed)
Established Patient Office Visit  Subjective:  Patient ID: Douglas Humphrey, male    DOB: 1950-12-17  Age: 72 y.o. MRN: [id number]  Chief Complaint  Patient presents with   Follow-up    3 month follow up    Patient in office for 3 month follow up, discuss recent lab work. Patient reports feeling well, no acute complaints today. Discussed recent lab work. Hgb A1c elevated, patient states he eats tomato sandwiches frequently over the summer, will decrease now that summer is over.  Lab work otherwise WNL.  Patient states he does [facility]ard [facility], always normal per patient.  Patient states he has not heard from [facility], PSA normal on Flomax.     No other concerns at this time.   Past Medical History:  Diagnosis Date   Chronic back pain    Crohn's disease (HCC)    Leaky heart valve     Past Surgical History:  Procedure Laterality Date   NO PAST SURGERIES      Social History   Socioeconomic History   Marital status: Single    Spouse name: Not on file   Number of children: Not on file   Years of education: Not on file   Highest education level: Not on file  Occupational History   Not on file  Tobacco Use   Smoking status: Never   Smokeless tobacco: Never  Vaping Use   Vaping status: Never Used  Substance and Sexual Activity   Alcohol use: No   Drug use: No   Sexual activity: Yes    Birth control/protection: None  Other Topics Concern   Not on file  Social History Narrative   Not on file   Social Determinants of Health   Financial Resource Strain: Not on file  Food Insecurity: Not on file  Transportation Needs: Not on file  Physical Activity: Not on file  Stress: Not on file  Social Connections: Not on file  Intimate Partner Violence: Not on file    Family History  Problem Relation Age of Onset   CAD Mother    COPD Mother    CAD Father     No Known Allergies  Review of Systems  Constitutional: Negative.   HENT: Negative.     Eyes: Negative.   Respiratory: Negative.  Negative for shortness of breath.   Cardiovascular: Negative.  Negative for chest pain.  Gastrointestinal: Negative.  Negative for abdominal pain, constipation and diarrhea.  Genitourinary: Negative.   Musculoskeletal:  Negative for joint pain and myalgias.  Skin: Negative.   Neurological: Negative.  Negative for dizziness and headaches.  Endo/Heme/Allergies: Negative.   All other systems reviewed and are negative.      Objective:   BP 130/80   Pulse 80   Ht 5\' 9"  (1.753 m)   Wt 203 lb 3.2 oz (92.2 kg)   SpO2 96%   BMI 30.01 kg/m   Vitals:   01/03/23 1126  BP: 130/80  Pulse: 80  Height: 5\' 9"  (1.753 m)  Weight: 203 lb 3.2 oz (92.2 kg)  SpO2: 96%  BMI (Calculated): 29.99    Physical Exam Nursing note reviewed.  Constitutional:      Appearance: Normal appearance. He is normal weight.  HENT:     Head: Normocephalic and atraumatic.     Nose: Nose normal.     Mouth/Throat:     Mouth: Mucous membranes are moist.     Pharynx: Oropharynx is clear.  Eyes:  Extraocular Movements: Extraocular movements intact.     Conjunctiva/sclera: Conjunctivae normal.     Pupils: Pupils are equal, round, and reactive to light.  Cardiovascular:     Rate and Rhythm: Normal rate and regular rhythm.     Pulses: Normal pulses.     Heart sounds: Normal heart sounds.  Pulmonary:     Effort: Pulmonary effort is normal.     Breath sounds: Normal breath sounds.  Abdominal:     General: Abdomen is flat. Bowel sounds are normal.     Palpations: Abdomen is soft.  Musculoskeletal:        General: Normal range of motion.     Cervical back: Normal range of motion.  Skin:    General: Skin is warm and dry.  Neurological:     General: No focal deficit present.     Mental Status: He is alert and oriented to person, place, and time.  Psychiatric:        Mood and Affect: Mood normal.        Behavior: Behavior normal.        Thought Content: Thought  content normal.        Judgment: Judgment normal.      No results found for any visits on 01/03/23.  Recent Results (from the past 2160 hour(s))  Hemoglobin A1c     Status: Abnormal   Collection Time: 12/31/22  9:28 AM  Result Value Ref Range   Hgb A1c MFr Bld 6.0 (H) 4.8 - 5.6 %    Comment:          Prediabetes: 5.7 - 6.4          Diabetes: >6.4          Glycemic control for adults with diabetes: <7.0    Est. average glucose Bld gHb Est-mCnc 126 mg/dL  Lipid panel     Status: None   Collection Time: 12/31/22  9:28 AM  Result Value Ref Range   Cholesterol, Total 110 100 - 199 mg/dL   Triglycerides 69 0 - 149 mg/dL   HDL 42 >62 mg/dL   VLDL Cholesterol Cal 15 5 - 40 mg/dL   LDL Chol Calc (NIH) 53 0 - 99 mg/dL   Chol/HDL Ratio 2.6 0.0 - 5.0 ratio    Comment:                                   T. Chol/HDL Ratio                                             Men  Women                               1/2 Avg.Risk  3.4    3.3                                   Avg.Risk  5.0    4.4                                2X Avg.Risk  9.6    7.1  3X Avg.Risk 23.4   11.0   CBC with Differential/Platelet     Status: Abnormal   Collection Time: 12/31/22  9:28 AM  Result Value Ref Range   WBC 6.3 3.4 - 10.8 x10E3/uL   RBC 4.60 4.14 - 5.80 x10E6/uL   Hemoglobin 14.5 13.0 - 17.7 g/dL   Hematocrit 16.1 09.6 - 51.0 %   MCV 92 79 - 97 fL   MCH 31.5 26.6 - 33.0 pg   MCHC 34.4 31.5 - 35.7 g/dL   RDW 04.5 40.9 - 81.1 %   Platelets 207 150 - 450 x10E3/uL   Neutrophils 54 Not Estab. %   Lymphs 27 Not Estab. %   Monocytes 8 Not Estab. %   Eos 10 Not Estab. %   Basos 1 Not Estab. %   Neutrophils Absolute 3.3 1.4 - 7.0 x10E3/uL   Lymphocytes Absolute 1.7 0.7 - 3.1 x10E3/uL   Monocytes Absolute 0.5 0.1 - 0.9 x10E3/uL   EOS (ABSOLUTE) 0.6 (H) 0.0 - 0.4 x10E3/uL   Basophils Absolute 0.1 0.0 - 0.2 x10E3/uL   Immature Granulocytes 0 Not Estab. %   Immature Grans (Abs) 0.0  0.0 - 0.1 x10E3/uL  CMP14+EGFR     Status: Abnormal   Collection Time: 12/31/22  9:28 AM  Result Value Ref Range   Glucose 132 (H) 70 - 99 mg/dL   BUN 12 8 - 27 mg/dL   Creatinine, Ser 9.14 0.76 - 1.27 mg/dL   eGFR 70 >78 GN/FAO/1.30   BUN/Creatinine Ratio 11 10 - 24   Sodium 142 134 - 144 mmol/L   Potassium 4.9 3.5 - 5.2 mmol/L   Chloride 104 96 - 106 mmol/L   CO2 25 20 - 29 mmol/L   Calcium 10.3 (H) 8.6 - 10.2 mg/dL   Total Protein 6.4 6.0 - 8.5 g/dL   Albumin 4.6 3.9 - 4.9 g/dL   Globulin, Total 1.8 1.5 - 4.5 g/dL   Bilirubin Total 0.8 0.0 - 1.2 mg/dL   Alkaline Phosphatase 46 44 - 121 IU/L   AST 22 0 - 40 IU/L   ALT 20 0 - 44 IU/L  TSH     Status: None   Collection Time: 12/31/22  9:28 AM  Result Value Ref Range   TSH 1.840 0.450 - 4.500 uIU/mL  PSA     Status: None   Collection Time: 12/31/22  9:28 AM  Result Value Ref Range   Prostate Specific Ag, Serum 3.4 0.0 - 4.0 ng/mL    Comment: Roche ECLIA methodology. According to the American Urological Association, Serum PSA should decrease and remain at undetectable levels after radical prostatectomy. The AUA defines biochemical recurrence as an initial PSA value 0.2 ng/mL or greater followed by a subsequent confirmatory PSA value 0.2 ng/mL or greater. Values obtained with different assay methods or kits cannot be used interchangeably. Results cannot be interpreted as absolute evidence of the presence or absence of malignant disease.       Assessment & Plan:  Decrease sugar and starch intake to lower Hgb A1c.  Continue all medications.   Problem List Items Addressed This Visit       Cardiovascular and Mediastinum   Essential hypertension - Primary (Chronic)     Other   Prediabetes   Mixed hyperlipidemia    Return in about 3 months (around 04/04/2023) for with fasting labs prior.   Total time spent: 25 minutes  Google, NP  01/03/2023   This document may have been prepared by [employer] Voice  Recognition software and as such may include unintentional dictation errors.

## 2023-01-14 ENCOUNTER — Encounter: Payer: Self-pay | Admitting: Cardiovascular Disease

## 2023-01-14 ENCOUNTER — Ambulatory Visit: Payer: Medicare Other | Admitting: Cardiovascular Disease

## 2023-01-14 VITALS — BP 122/80 | HR 68 | Ht 69.0 in | Wt 205.0 lb

## 2023-01-14 DIAGNOSIS — E782 Mixed hyperlipidemia: Secondary | ICD-10-CM

## 2023-01-14 DIAGNOSIS — I10 Essential (primary) hypertension: Secondary | ICD-10-CM

## 2023-01-14 DIAGNOSIS — I2510 Atherosclerotic heart disease of native coronary artery without angina pectoris: Secondary | ICD-10-CM | POA: Diagnosis not present

## 2023-01-14 DIAGNOSIS — I5032 Chronic diastolic (congestive) heart failure: Secondary | ICD-10-CM

## 2023-01-14 NOTE — Progress Notes (Signed)
Cardiology Office Note   Date:  01/14/2023   ID:  Douglas Humphrey, DOB 10-29-1950, MRN [id number]  PCP:  Marisue Ivan, NP  Cardiologist:  Adrian Blackwater, MD      History of Present Illness: Douglas Humphrey is a 72 y.o. male who presents for  Chief Complaint  Patient presents with   Follow-up    Doing well      Past Medical History:  Diagnosis Date   Chronic back pain    Crohn's disease (HCC)    Leaky heart valve      Past Surgical History:  Procedure Laterality Date   NO PAST SURGERIES       Current Outpatient Medications  Medication Sig Dispense Refill   co-enzyme Q-10 30 MG capsule Take 30 mg by mouth 3 (three) times daily.     losartan (COZAAR) 25 MG tablet TAKE 1 TABLET BY MOUTH EVERY DAY 90 tablet 2   metoprolol succinate (TOPROL-XL) 25 MG 24 hr tablet Take 1 tablet (25 mg total) by mouth daily. 90 tablet 3   Multiple Vitamin (MULTIVITAMIN WITH MINERALS) TABS tablet Take 1 tablet by mouth at bedtime.     multivitamin-lutein (OCUVITE-LUTEIN) CAPS capsule Take 1 capsule by mouth daily.     omega-3 acid ethyl esters (LOVAZA) 1 g capsule Take by mouth 2 (two) times daily.     omeprazole (PRILOSEC) 20 MG capsule Take 20 mg by mouth daily.     polycarbophil (FIBERCON) 625 MG tablet Take 625 mg by mouth at bedtime.     rosuvastatin (CRESTOR) 10 MG tablet Take 1 tablet (10 mg total) by mouth daily. 90 tablet 3   spironolactone (ALDACTONE) 25 MG tablet Take 1 tablet (25 mg total) by mouth daily. 90 tablet 3   tamsulosin (FLOMAX) 0.4 MG CAPS capsule Take 1 capsule (0.4 mg total) by mouth daily. 30 capsule 11   Turmeric (QC TUMERIC COMPLEX) 500 MG CAPS Take 1,000 mg by mouth.     No current facility-administered medications for this visit.    Allergies:   Patient has no known allergies.    Social History:   reports that he has never smoked. He has never used smokeless tobacco. He reports that he does not drink alcohol and does not use drugs.   Family History:   family history includes CAD in his father and mother; COPD in his mother.    ROS:     Review of Systems  Constitutional: Negative.   HENT: Negative.    Eyes: Negative.   Respiratory: Negative.    Gastrointestinal: Negative.   Genitourinary: Negative.   Musculoskeletal: Negative.   Skin: Negative.   Neurological: Negative.   Endo/Heme/Allergies: Negative.   Psychiatric/Behavioral: Negative.    All other systems reviewed and are negative.     All other systems are reviewed and negative.    PHYSICAL EXAM: VS:  BP 122/80   Pulse 68   Ht 5\' 9"  (1.753 m)   Wt 205 lb (93 kg)   SpO2 92%   BMI 30.27 kg/m  , BMI Body mass index is 30.27 kg/m. Last weight:  Wt Readings from Last 3 Encounters:  01/14/23 205 lb (93 kg)  01/03/23 203 lb 3.2 oz (92.2 kg)  09/17/22 197 lb (89.4 kg)     Physical Exam Vitals reviewed.  Constitutional:      Appearance: Normal appearance. He is normal weight.  HENT:     Head: Normocephalic.     Nose: Nose normal.  Mouth/Throat:     Mouth: Mucous membranes are moist.  Eyes:     Pupils: Pupils are equal, round, and reactive to light.  Cardiovascular:     Rate and Rhythm: Normal rate and regular rhythm.     Pulses: Normal pulses.     Heart sounds: Normal heart sounds.  Pulmonary:     Effort: Pulmonary effort is normal.  Abdominal:     General: Abdomen is flat. Bowel sounds are normal.  Musculoskeletal:        General: Normal range of motion.     Cervical back: Normal range of motion.  Skin:    General: Skin is warm.  Neurological:     General: No focal deficit present.     Mental Status: He is alert.  Psychiatric:        Mood and Affect: Mood normal.       EKG:   Recent Labs: 12/31/2022: ALT 20; BUN 12; Creatinine, Ser 1.13; Hemoglobin 14.5; Platelets 207; Potassium 4.9; Sodium 142; TSH 1.840    Lipid Panel    Component Value Date/Time   CHOL 110 12/31/2022 0928   TRIG 69 12/31/2022 0928   HDL 42 12/31/2022 0928    CHOLHDL 2.6 12/31/2022 0928   CHOLHDL 4.7 05/15/2015 0527   VLDL 30 05/15/2015 0527   LDLCALC 53 12/31/2022 0928      Other studies Reviewed: Additional studies/ records that were reviewed today include:  Review of the above records demonstrates:       No data to display            ASSESSMENT AND PLAN:    ICD-10-CM   1. Coronary artery disease involving native coronary artery of native heart without angina pectoris  I25.10    stable    2. Essential hypertension  I10     3. Mixed hyperlipidemia  E78.2     4. Diastolic CHF, chronic (HCC)  I50.32        Problem List Items Addressed This Visit       Cardiovascular and Mediastinum   Diastolic CHF, chronic (HCC) (Chronic)   Essential hypertension (Chronic)   Coronary artery disease involving native coronary artery of native heart without angina pectoris - Primary     Other   Mixed hyperlipidemia       Disposition:   Return in about 5 months (around 06/16/2023).    Total time spent: 30 minutes  Signed,  Adrian Blackwater, MD  01/14/2023 9:22 AM    [facility]

## 2023-04-04 ENCOUNTER — Ambulatory Visit: Payer: Medicare Other | Admitting: Cardiology

## 2023-04-11 ENCOUNTER — Ambulatory Visit (INDEPENDENT_AMBULATORY_CARE_PROVIDER_SITE_OTHER): Payer: Medicare Other | Admitting: Cardiology

## 2023-04-11 ENCOUNTER — Encounter: Payer: Self-pay | Admitting: Cardiology

## 2023-04-11 VITALS — BP 108/78 | HR 82 | Ht 69.0 in | Wt 204.4 lb

## 2023-04-11 DIAGNOSIS — E782 Mixed hyperlipidemia: Secondary | ICD-10-CM | POA: Diagnosis not present

## 2023-04-11 DIAGNOSIS — I10 Essential (primary) hypertension: Secondary | ICD-10-CM

## 2023-04-11 DIAGNOSIS — Z0000 Encounter for general adult medical examination without abnormal findings: Secondary | ICD-10-CM | POA: Diagnosis not present

## 2023-04-11 DIAGNOSIS — R7303 Prediabetes: Secondary | ICD-10-CM | POA: Diagnosis not present

## 2023-04-11 NOTE — Progress Notes (Signed)
Established Patient Office Visit  Subjective:  Patient ID: Douglas Humphrey, male    DOB: 01/15/1951  Age: 72 y.o. MRN: [id number]  Chief Complaint  Patient presents with   Annual Exam    AWV    Patient in office for annual medicare wellness exam.     No other concerns at this time.   Past Medical History:  Diagnosis Date   Chronic back pain    Crohn's disease (HCC)    Leaky heart valve     Past Surgical History:  Procedure Laterality Date   NO PAST SURGERIES      Social History   Socioeconomic History   Marital status: Single    Spouse name: Not on file   Number of children: Not on file   Years of education: Not on file   Highest education level: Not on file  Occupational History   Not on file  Tobacco Use   Smoking status: Never   Smokeless tobacco: Never  Vaping Use   Vaping status: Never Used  Substance and Sexual Activity   Alcohol use: No   Drug use: No   Sexual activity: Yes    Birth control/protection: None  Other Topics Concern   Not on file  Social History Narrative   Not on file   Social Drivers of Health   Financial Resource Strain: Low Risk  (04/11/2023)   Overall Financial Resource Strain (CARDIA)    Difficulty of Paying Living Expenses: Not hard at all  Food Insecurity: No Food Insecurity (04/11/2023)   Hunger Vital Sign    Worried About Running Out of Food in the Last Year: Never true    Ran Out of Food in the Last Year: Never true  Transportation Needs: No Transportation Needs (04/11/2023)   PRAPARE - Administrator, Civil Service (Medical): No    Lack of Transportation (Non-Medical): No  Physical Activity: Insufficiently Active (04/11/2023)   Exercise Vital Sign    Days of Exercise per Week: 3 days    Minutes of Exercise per Session: 20 min  Stress: No Stress Concern Present (04/11/2023)   [employer] of Occupational Health - Occupational Stress Questionnaire    Feeling of Stress : Only a little  Social  Connections: Not on file  Intimate Partner Violence: Not At Risk (04/11/2023)   Humiliation, Afraid, Rape, and Kick questionnaire    Fear of Current or Ex-Partner: No    Emotionally Abused: No    Physically Abused: No    Sexually Abused: No    Family History  Problem Relation Age of Onset   CAD Mother    COPD Mother    CAD Father     No Known Allergies  Outpatient Medications Prior to Visit  Medication Sig   co-enzyme Q-10 30 MG capsule Take 30 mg by mouth 3 (three) times daily.   losartan (COZAAR) 25 MG tablet TAKE 1 TABLET BY MOUTH EVERY DAY   metoprolol succinate (TOPROL-XL) 25 MG 24 hr tablet Take 1 tablet (25 mg total) by mouth daily.   Multiple Vitamin (MULTIVITAMIN WITH MINERALS) TABS tablet Take 1 tablet by mouth at bedtime.   multivitamin-lutein (OCUVITE-LUTEIN) CAPS capsule Take 1 capsule by mouth daily.   omega-3 acid ethyl esters (LOVAZA) 1 g capsule Take by mouth 2 (two) times daily.   omeprazole (PRILOSEC) 20 MG capsule Take 20 mg by mouth daily.   polycarbophil (FIBERCON) 625 MG tablet Take 625 mg by mouth at bedtime.   rosuvastatin (CRESTOR)  10 MG tablet Take 1 tablet (10 mg total) by mouth daily.   spironolactone (ALDACTONE) 25 MG tablet Take 1 tablet (25 mg total) by mouth daily.   tamsulosin (FLOMAX) 0.4 MG CAPS capsule Take 1 capsule (0.4 mg total) by mouth daily.   Turmeric (QC TUMERIC COMPLEX) 500 MG CAPS Take 1,000 mg by mouth.   No facility-administered medications prior to visit.    Review of Systems  Constitutional: Negative.   HENT: Negative.    Eyes: Negative.   Respiratory: Negative.  Negative for shortness of breath.   Cardiovascular: Negative.  Negative for chest pain.  Gastrointestinal: Negative.  Negative for abdominal pain, constipation and diarrhea.  Genitourinary: Negative.   Musculoskeletal:  Negative for joint pain and myalgias.  Skin: Negative.   Neurological: Negative.  Negative for dizziness and headaches.  Endo/Heme/Allergies:  Negative.   All other systems reviewed and are negative.      Objective:   BP 108/78   Pulse 82   Ht 5\' 9"  (1.753 m)   Wt 204 lb 6.4 oz (92.7 kg)   SpO2 93%   BMI 30.18 kg/m   Vitals:   04/11/23 1005  BP: 108/78  Pulse: 82  Height: 5\' 9"  (1.753 m)  Weight: 204 lb 6.4 oz (92.7 kg)  SpO2: 93%  BMI (Calculated): 30.17    Physical Exam Nursing note reviewed.  Constitutional:      Appearance: Normal appearance. He is normal weight.  HENT:     Head: Normocephalic and atraumatic.     Nose: Nose normal.     Mouth/Throat:     Mouth: Mucous membranes are moist.     Pharynx: Oropharynx is clear.  Eyes:     Extraocular Movements: Extraocular movements intact.     Conjunctiva/sclera: Conjunctivae normal.     Pupils: Pupils are equal, round, and reactive to light.  Cardiovascular:     Rate and Rhythm: Normal rate and regular rhythm.     Pulses: Normal pulses.     Heart sounds: Normal heart sounds.  Pulmonary:     Effort: Pulmonary effort is normal.     Breath sounds: Normal breath sounds.  Abdominal:     General: Abdomen is flat. Bowel sounds are normal.     Palpations: Abdomen is soft.  Musculoskeletal:        General: Normal range of motion.     Cervical back: Normal range of motion.  Skin:    General: Skin is warm and dry.  Neurological:     General: No focal deficit present.     Mental Status: He is alert and oriented to person, place, and time.  Psychiatric:        Mood and Affect: Mood normal.        Behavior: Behavior normal.        Thought Content: Thought content normal.        Judgment: Judgment normal.      No results found for any visits on 04/11/23.  No results found for this or any previous visit (from the past 2160 hours).    Assessment & Plan:  Continue same medications.   Problem List Items Addressed This Visit       Cardiovascular and Mediastinum   Essential hypertension (Chronic)     Other   Prediabetes   Mixed hyperlipidemia    Encounter for annual health examination - Primary    Return in about 6 weeks (around 05/23/2023) for keep January appt.   Total time spent:  25 minutes  Google, NP  04/11/2023   This document may have been prepared by [employer] Voice Recognition software and as such may include unintentional dictation errors.

## 2023-05-24 ENCOUNTER — Other Ambulatory Visit: Payer: Medicare Other

## 2023-05-24 DIAGNOSIS — I10 Essential (primary) hypertension: Secondary | ICD-10-CM | POA: Diagnosis not present

## 2023-05-24 DIAGNOSIS — R7303 Prediabetes: Secondary | ICD-10-CM | POA: Diagnosis not present

## 2023-05-24 DIAGNOSIS — E782 Mixed hyperlipidemia: Secondary | ICD-10-CM

## 2023-05-25 LAB — LIPID PANEL
Chol/HDL Ratio: 2.8 {ratio} (ref 0.0–5.0)
Cholesterol, Total: 112 mg/dL (ref 100–199)
HDL: 40 mg/dL (ref >39)
LDL Chol Calc (NIH): 54 mg/dL (ref 0–99)
Triglycerides: 91 mg/dL (ref 0–149)
VLDL Cholesterol Cal: 18 mg/dL (ref 5–40)

## 2023-05-25 LAB — CMP14+EGFR
ALT: 21 [IU]/L (ref 0–44)
AST: 21 [IU]/L (ref 0–40)
Albumin: 4.6 g/dL (ref 3.8–4.8)
Alkaline Phosphatase: 51 [IU]/L (ref 44–121)
BUN/Creatinine Ratio: 14 (ref 10–24)
BUN: 13 mg/dL (ref 8–27)
Bilirubin Total: 1 mg/dL (ref 0.0–1.2)
CO2: 26 mmol/L (ref 20–29)
Calcium: 9.9 mg/dL (ref 8.6–10.2)
Chloride: 103 mmol/L (ref 96–106)
Creatinine, Ser: 0.95 mg/dL (ref 0.76–1.27)
Globulin, Total: 2.1 g/dL (ref 1.5–4.5)
Glucose: 127 mg/dL — ABNORMAL HIGH (ref 70–99)
Potassium: 4.6 mmol/L (ref 3.5–5.2)
Sodium: 142 mmol/L (ref 134–144)
Total Protein: 6.7 g/dL (ref 6.0–8.5)
eGFR: 86 mL/min/{1.73_m2} (ref >59)

## 2023-05-25 LAB — HEMOGLOBIN A1C
Est. average glucose Bld gHb Est-mCnc: 131 mg/dL
Hgb A1c MFr Bld: 6.2 % — ABNORMAL HIGH (ref 4.8–5.6)

## 2023-05-25 LAB — TSH: TSH: 1.48 u[IU]/mL (ref 0.450–4.500)

## 2023-05-27 ENCOUNTER — Ambulatory Visit (INDEPENDENT_AMBULATORY_CARE_PROVIDER_SITE_OTHER): Payer: Medicare Other | Admitting: Cardiology

## 2023-05-27 ENCOUNTER — Encounter: Payer: Self-pay | Admitting: Cardiology

## 2023-05-27 VITALS — BP 112/70 | HR 83 | Ht 69.0 in | Wt 200.0 lb

## 2023-05-27 DIAGNOSIS — I10 Essential (primary) hypertension: Secondary | ICD-10-CM

## 2023-05-27 DIAGNOSIS — E782 Mixed hyperlipidemia: Secondary | ICD-10-CM | POA: Diagnosis not present

## 2023-05-27 DIAGNOSIS — R7303 Prediabetes: Secondary | ICD-10-CM | POA: Diagnosis not present

## 2023-05-27 NOTE — Progress Notes (Signed)
Established Patient Office Visit  Subjective:  Patient ID: Douglas Humphrey, male    DOB: [birth year]  Age: 73 y.o. MRN: [id number]  Chief Complaint  Patient presents with   Follow-up    5 Months Follow Up    Patient in office for 5 month follow up, discuss recent lab results.  Patient states he does Cologuard yearly through [facility], always normal per patient.  Patient doing well. No complaints today. Discussed recent lab work. LDL at goal. Hgb A1c elevated, work on decreasing sugar and starch intake.     No other concerns at this time.   Past Medical History:  Diagnosis Date   Chronic back pain    Crohn's disease (HCC)    Leaky heart valve     Past Surgical History:  Procedure Laterality Date   NO PAST SURGERIES      Social History   Socioeconomic History   Marital status: Single    Spouse name: Not on file   Number of children: Not on file   Years of education: Not on file   Highest education level: Not on file  Occupational History   Not on file  Tobacco Use   Smoking status: Never   Smokeless tobacco: Never  Vaping Use   Vaping status: Never Used  Substance and Sexual Activity   Alcohol use: No   Drug use: No   Sexual activity: Yes    Birth control/protection: None  Other Topics Concern   Not on file  Social History Narrative   Not on file   Social Drivers of Health   Financial Resource Strain: Low Risk  (04/11/2023)   Overall Financial Resource Strain (CARDIA)    Difficulty of Paying Living Expenses: Not hard at all  Food Insecurity: No Food Insecurity (04/11/2023)   Hunger Vital Sign    Worried About Running Out of Food in the Last Year: Never true    Ran Out of Food in the Last Year: Never true  Transportation Needs: No Transportation Needs (04/11/2023)   PRAPARE - Administrator, Civil Service (Medical): No    Lack of Transportation (Non-Medical): No  Physical Activity: Insufficiently Active (04/11/2023)   Exercise Vital Sign     Days of Exercise per Week: 3 days    Minutes of Exercise per Session: 20 min  Stress: No Stress Concern Present (04/11/2023)   [employer] of Occupational Health - Occupational Stress Questionnaire    Feeling of Stress : Only a little  Social Connections: Not on file  Intimate Partner Violence: Not At Risk (04/11/2023)   Humiliation, Afraid, Rape, and Kick questionnaire    Fear of Current or Ex-Partner: No    Emotionally Abused: No    Physically Abused: No    Sexually Abused: No    Family History  Problem Relation Age of Onset   CAD Mother    COPD Mother    CAD Father     No Known Allergies  Outpatient Medications Prior to Visit  Medication Sig   co-enzyme Q-10 30 MG capsule Take 30 mg by mouth 3 (three) times daily.   losartan (COZAAR) 25 MG tablet TAKE 1 TABLET BY MOUTH EVERY DAY   metoprolol succinate (TOPROL-XL) 25 MG 24 hr tablet Take 1 tablet (25 mg total) by mouth daily.   Multiple Vitamin (MULTIVITAMIN WITH MINERALS) TABS tablet Take 1 tablet by mouth at bedtime.   multivitamin-lutein (OCUVITE-LUTEIN) CAPS capsule Take 1 capsule by mouth daily.   omega-3 acid ethyl  esters (LOVAZA) 1 g capsule Take by mouth 2 (two) times daily.   omeprazole (PRILOSEC) 20 MG capsule Take 20 mg by mouth daily.   polycarbophil (FIBERCON) 625 MG tablet Take 625 mg by mouth at bedtime.   rosuvastatin (CRESTOR) 10 MG tablet Take 1 tablet (10 mg total) by mouth daily.   spironolactone (ALDACTONE) 25 MG tablet Take 1 tablet (25 mg total) by mouth daily.   tamsulosin (FLOMAX) 0.4 MG CAPS capsule Take 1 capsule (0.4 mg total) by mouth daily.   Turmeric (QC TUMERIC COMPLEX) 500 MG CAPS Take 1,000 mg by mouth.   No facility-administered medications prior to visit.    Review of Systems  Constitutional: Negative.   HENT: Negative.    Eyes: Negative.   Respiratory: Negative.  Negative for shortness of breath.   Cardiovascular: Negative.  Negative for chest pain.  Gastrointestinal:  Negative.  Negative for abdominal pain, constipation and diarrhea.  Genitourinary: Negative.   Musculoskeletal:  Negative for joint pain and myalgias.  Skin: Negative.   Neurological: Negative.  Negative for dizziness and headaches.  Endo/Heme/Allergies: Negative.   All other systems reviewed and are negative.      Objective:   BP 112/70   Pulse 83   Ht 5\' 9"  (1.753 m)   Wt 200 lb (90.7 kg)   SpO2 94%   BMI 29.53 kg/m   Vitals:   05/27/23 0903  BP: 112/70  Pulse: 83  Height: 5\' 9"  (1.753 m)  Weight: 200 lb (90.7 kg)  SpO2: 94%  BMI (Calculated): 29.52    Physical Exam Nursing note reviewed.  Constitutional:      Appearance: Normal appearance. He is normal weight.  HENT:     Head: Normocephalic and atraumatic.     Nose: Nose normal.     Mouth/Throat:     Mouth: Mucous membranes are moist.     Pharynx: Oropharynx is clear.  Eyes:     Extraocular Movements: Extraocular movements intact.     Conjunctiva/sclera: Conjunctivae normal.     Pupils: Pupils are equal, round, and reactive to light.  Cardiovascular:     Rate and Rhythm: Normal rate and regular rhythm.     Pulses: Normal pulses.     Heart sounds: Normal heart sounds.  Pulmonary:     Effort: Pulmonary effort is normal.     Breath sounds: Normal breath sounds.  Abdominal:     General: Abdomen is flat. Bowel sounds are normal.     Palpations: Abdomen is soft.  Musculoskeletal:        General: Normal range of motion.     Cervical back: Normal range of motion.  Skin:    General: Skin is warm and dry.  Neurological:     General: No focal deficit present.     Mental Status: He is alert and oriented to person, place, and time.  Psychiatric:        Mood and Affect: Mood normal.        Behavior: Behavior normal.        Thought Content: Thought content normal.        Judgment: Judgment normal.      No results found for any visits on 05/27/23.  Recent Results (from the past 2160 hours)  Hemoglobin A1c      Status: Abnormal   Collection Time: 05/24/23 10:21 AM  Result Value Ref Range   Hgb A1c MFr Bld 6.2 (H) 4.8 - 5.6 %    Comment:  Prediabetes: 5.7 - 6.4          Diabetes: >6.4          Glycemic control for adults with diabetes: <7.0    Est. average glucose Bld gHb Est-mCnc 131 mg/dL  TSH     Status: None   Collection Time: 05/24/23 10:21 AM  Result Value Ref Range   TSH 1.480 0.450 - 4.500 uIU/mL  CMP14+EGFR     Status: Abnormal   Collection Time: 05/24/23 10:21 AM  Result Value Ref Range   Glucose 127 (H) 70 - 99 mg/dL   BUN 13 8 - 27 mg/dL   Creatinine, Ser 0.98 0.76 - 1.27 mg/dL   eGFR 86 >11 BJ/YNW/2.95   BUN/Creatinine Ratio 14 10 - 24   Sodium 142 134 - 144 mmol/L   Potassium 4.6 3.5 - 5.2 mmol/L   Chloride 103 96 - 106 mmol/L   CO2 26 20 - 29 mmol/L   Calcium 9.9 8.6 - 10.2 mg/dL   Total Protein 6.7 6.0 - 8.5 g/dL   Albumin 4.6 3.8 - 4.8 g/dL   Globulin, Total 2.1 1.5 - 4.5 g/dL   Bilirubin Total 1.0 0.0 - 1.2 mg/dL   Alkaline Phosphatase 51 44 - 121 IU/L   AST 21 0 - 40 IU/L   ALT 21 0 - 44 IU/L  Lipid panel     Status: None   Collection Time: 05/24/23 10:21 AM  Result Value Ref Range   Cholesterol, Total 112 100 - 199 mg/dL   Triglycerides 91 0 - 149 mg/dL   HDL 40 >62 mg/dL   VLDL Cholesterol Cal 18 5 - 40 mg/dL   LDL Chol Calc (NIH) 54 0 - 99 mg/dL   Chol/HDL Ratio 2.8 0.0 - 5.0 ratio    Comment:                                   T. Chol/HDL Ratio                                             Men  Women                               1/2 Avg.Risk  3.4    3.3                                   Avg.Risk  5.0    4.4                                2X Avg.Risk  9.6    7.1                                3X Avg.Risk 23.4   11.0       Assessment & Plan:  Continue same medications.   Problem List Items Addressed This Visit       Cardiovascular and Mediastinum   Essential hypertension - Primary (Chronic)     Other   Prediabetes   Mixed  hyperlipidemia  Return in about 5 months (around 10/24/2023) for with fasting labs prior.   Total time spent: 25 minutes  [employer], NP  05/27/2023   This document may have been prepared by Dragon Voice Recognition software and as such may include unintentional dictation errors.

## 2023-05-29 ENCOUNTER — Other Ambulatory Visit: Payer: Self-pay | Admitting: Cardiovascular Disease

## 2023-05-29 DIAGNOSIS — I509 Heart failure, unspecified: Secondary | ICD-10-CM

## 2023-06-17 ENCOUNTER — Ambulatory Visit: Payer: Medicare Other | Admitting: Cardiovascular Disease

## 2023-07-01 ENCOUNTER — Encounter: Payer: Self-pay | Admitting: Cardiovascular Disease

## 2023-07-01 ENCOUNTER — Ambulatory Visit: Payer: Medicare Other | Admitting: Cardiovascular Disease

## 2023-07-01 VITALS — BP 117/73 | HR 77 | Ht 69.0 in | Wt 202.0 lb

## 2023-07-01 DIAGNOSIS — R7303 Prediabetes: Secondary | ICD-10-CM

## 2023-07-01 DIAGNOSIS — I2510 Atherosclerotic heart disease of native coronary artery without angina pectoris: Secondary | ICD-10-CM

## 2023-07-01 DIAGNOSIS — I5032 Chronic diastolic (congestive) heart failure: Secondary | ICD-10-CM | POA: Diagnosis not present

## 2023-07-01 DIAGNOSIS — I10 Essential (primary) hypertension: Secondary | ICD-10-CM

## 2023-07-01 DIAGNOSIS — E782 Mixed hyperlipidemia: Secondary | ICD-10-CM

## 2023-07-01 NOTE — Progress Notes (Signed)
 Cardiology Office Note   Date:  07/01/2023   ID:  Douglas Humphrey, DOB 1951-01-03, MRN [id number]  PCP:  Marisue Ivan, NP  Cardiologist:  Adrian Blackwater, MD      History of Present Illness: Douglas Humphrey is a 73 y.o. male who presents for  Chief Complaint  Patient presents with   Follow-up    5 month follow up     Doing well      Past Medical History:  Diagnosis Date   Chronic back pain    Crohn's disease (HCC)    Leaky heart valve      Past Surgical History:  Procedure Laterality Date   NO PAST SURGERIES       Current Outpatient Medications  Medication Sig Dispense Refill   FLUZONE HIGH-DOSE 0.5 ML injection Inject 0.5 mLs into the muscle once.     co-enzyme Q-10 30 MG capsule Take 30 mg by mouth 3 (three) times daily.     losartan (COZAAR) 25 MG tablet TAKE 1 TABLET (25 MG TOTAL) BY MOUTH DAILY. 90 tablet 3   metoprolol succinate (TOPROL-XL) 25 MG 24 hr tablet TAKE 1 TABLET (25 MG TOTAL) BY MOUTH DAILY. 90 tablet 3   Multiple Vitamin (MULTIVITAMIN WITH MINERALS) TABS tablet Take 1 tablet by mouth at bedtime.     multivitamin-lutein (OCUVITE-LUTEIN) CAPS capsule Take 1 capsule by mouth daily.     omega-3 acid ethyl esters (LOVAZA) 1 g capsule Take by mouth 2 (two) times daily.     omeprazole (PRILOSEC) 20 MG capsule Take 20 mg by mouth daily.     polycarbophil (FIBERCON) 625 MG tablet Take 625 mg by mouth at bedtime.     rosuvastatin (CRESTOR) 10 MG tablet TAKE 1 TABLET BY MOUTH EVERY DAY 90 tablet 3   spironolactone (ALDACTONE) 25 MG tablet TAKE 1 TABLET (25 MG TOTAL) BY MOUTH DAILY. 90 tablet 3   tamsulosin (FLOMAX) 0.4 MG CAPS capsule Take 1 capsule (0.4 mg total) by mouth daily. 30 capsule 11   Turmeric (QC TUMERIC COMPLEX) 500 MG CAPS Take 1,000 mg by mouth.     No current facility-administered medications for this visit.    Allergies:   Patient has no known allergies.    Social History:   reports that he has never smoked. He has never used  smokeless tobacco. He reports that he does not drink alcohol and does not use drugs.   Family History:  family history includes CAD in his father and mother; COPD in his mother.    ROS:     Review of Systems  Constitutional: Negative.   HENT: Negative.    Eyes: Negative.   Respiratory: Negative.    Gastrointestinal: Negative.   Genitourinary: Negative.   Musculoskeletal: Negative.   Skin: Negative.   Neurological: Negative.   Endo/Heme/Allergies: Negative.   Psychiatric/Behavioral: Negative.    All other systems reviewed and are negative.     All other systems are reviewed and negative.    PHYSICAL EXAM: VS:  BP 117/73   Pulse 77   Ht 5\' 9"  (1.753 m)   Wt 202 lb (91.6 kg)   SpO2 97%   BMI 29.83 kg/m  , BMI Body mass index is 29.83 kg/m. Last weight:  Wt Readings from Last 3 Encounters:  07/01/23 202 lb (91.6 kg)  05/27/23 200 lb (90.7 kg)  04/11/23 204 lb 6.4 oz (92.7 kg)     Physical Exam Vitals reviewed.  Constitutional:      Appearance:  Normal appearance. He is normal weight.  HENT:     Head: Normocephalic.     Nose: Nose normal.     Mouth/Throat:     Mouth: Mucous membranes are moist.  Eyes:     Pupils: Pupils are equal, round, and reactive to light.  Cardiovascular:     Rate and Rhythm: Normal rate and regular rhythm.     Pulses: Normal pulses.     Heart sounds: Normal heart sounds.  Pulmonary:     Effort: Pulmonary effort is normal.  Abdominal:     General: Abdomen is flat. Bowel sounds are normal.  Musculoskeletal:        General: Normal range of motion.     Cervical back: Normal range of motion.  Skin:    General: Skin is warm.  Neurological:     General: No focal deficit present.     Mental Status: He is alert.  Psychiatric:        Mood and Affect: Mood normal.       EKG:   Recent Labs: 12/31/2022: Hemoglobin 14.5; Platelets 207 05/24/2023: ALT 21; BUN 13; Creatinine, Ser 0.95; Potassium 4.6; Sodium 142; TSH 1.480    Lipid  Panel    Component Value Date/Time   CHOL 112 05/24/2023 1021   TRIG 91 05/24/2023 1021   HDL 40 05/24/2023 1021   CHOLHDL 2.8 05/24/2023 1021   CHOLHDL 4.7 05/15/2015 0527   VLDL 30 05/15/2015 0527   LDLCALC 54 05/24/2023 1021      Other studies Reviewed: Additional studies/ records that were reviewed today include:  Review of the above records demonstrates:       No data to display            ASSESSMENT AND PLAN:    ICD-10-CM   1. Essential hypertension  I10    stable    2. Mixed hyperlipidemia  E78.2     3. Prediabetes  R73.03     4. Coronary artery disease involving native coronary artery of native heart without angina pectoris  I25.10    Advise stress test and echo next visit    5. Diastolic CHF, chronic (HCC)  I50.32        Problem List Items Addressed This Visit       Cardiovascular and Mediastinum   Diastolic CHF, chronic (HCC) (Chronic)   Essential hypertension - Primary (Chronic)   Coronary artery disease involving native coronary artery of native heart without angina pectoris     Other   Prediabetes   Mixed hyperlipidemia       Disposition:   No follow-ups on file.    Total time spent: 30 minutes  Signed,  Adrian Blackwater, MD  07/01/2023 9:52 AM    [facility]

## 2023-10-06 ENCOUNTER — Ambulatory Visit: Admitting: Cardiovascular Disease

## 2023-10-07 ENCOUNTER — Ambulatory Visit: Admitting: Cardiovascular Disease

## 2023-10-24 ENCOUNTER — Ambulatory Visit: Payer: Medicare Other | Admitting: Cardiology

## 2023-10-28 ENCOUNTER — Emergency Department
Admission: EM | Admit: 2023-10-28 | Discharge: 2023-10-28 | Disposition: A | Discharge status: Home / Self Care | Attending: Emergency Medicine | Admitting: Emergency Medicine

## 2023-10-28 ENCOUNTER — Other Ambulatory Visit: Payer: Self-pay

## 2023-10-28 DIAGNOSIS — I110 Hypertensive heart disease with heart failure: Secondary | ICD-10-CM | POA: Diagnosis not present

## 2023-10-28 DIAGNOSIS — I509 Heart failure, unspecified: Secondary | ICD-10-CM | POA: Insufficient documentation

## 2023-10-28 DIAGNOSIS — L0231 Cutaneous abscess of buttock: Secondary | ICD-10-CM | POA: Diagnosis not present

## 2023-10-28 DIAGNOSIS — L03317 Cellulitis of buttock: Secondary | ICD-10-CM | POA: Diagnosis not present

## 2023-10-28 HISTORY — DX: Benign prostatic hyperplasia without lower urinary tract symptoms: N40.0

## 2023-10-28 MED ORDER — CEPHALEXIN 500 MG PO CAPS: 500.0000 mg | ORAL_CAPSULE | Freq: Four times a day (QID) | ORAL | 0 refills | Status: AC

## 2023-10-28 MED ORDER — HYDROCODONE-ACETAMINOPHEN 5-325 MG PO TABS: 1.0000 | ORAL_TABLET | Freq: Four times a day (QID) | ORAL | 0 refills | Status: AC | PRN

## 2023-10-28 NOTE — ED Provider Notes (Signed)
 [facility] Provider Note    Event Date/Time   First MD Initiated Contact with Patient 10/28/23 1120     (approximate)   History   Insect Bite   HPI  Douglas Humphrey is a 73 y.o. male presents to the ED with complaint of possible insect bite to his buttocks.  Patient states there was area of itching on his right buttocks approximately 1 week ago.  He has been soaking in Epsom salt but reports that is still continues to have some burning sensation.  He also has been using some peroxide to clean the area.  He denies any fever, chills, nausea or vomiting.  Patient has a history of hypertension, CHF, pyelonephritis, cellulitis, prediabetes, elevated PSA.     Physical Exam   Triage Vital Signs: ED Triage Vitals  Encounter Vitals Group     BP 10/28/23 1108 124/84     Girls Systolic BP Percentile --      Girls Diastolic BP Percentile --      Boys Systolic BP Percentile --      Boys Diastolic BP Percentile --      Pulse Rate 10/28/23 1108 77     Resp 10/28/23 1108 20     Temp 10/28/23 1108 98.8 F (37.1 C)     Temp Source 10/28/23 1108 Oral     SpO2 10/28/23 1108 98 %     Weight 10/28/23 1108 198 lb (89.8 kg)     Height 10/28/23 1108 5' 9 (1.753 m)     Head Circumference --      Peak Flow --      Pain Score 10/28/23 1109 0     Pain Loc --      Pain Education --      Exclude from Growth Chart --     Most recent vital signs: Vitals:   10/28/23 1108  BP: 124/84  Pulse: 77  Resp: 20  Temp: 98.8 F (37.1 C)  SpO2: 98%     General: Awake, no distress.  CV:  Good peripheral perfusion.  Resp:  Normal effort.  Abd:  No distention.  Other:  Right buttocks with an erythematous area that measures approximately 2.5 cm across with mild tenderness and no fluctuance.  No surrounding cellulitis present at this time.  No drainage noted.   ED Results / Procedures / Treatments   Labs (all labs ordered are listed, but only abnormal results are  displayed) Labs Reviewed - No data to display    PROCEDURES:  Critical Care performed:   Procedures   MEDICATIONS ORDERED IN ED: Medications - No data to display   IMPRESSION / MDM / ASSESSMENT AND PLAN / ED COURSE  I reviewed the triage vital signs and the nursing notes.   Differential diagnosis includes, but is not limited to, insect bite, localized reaction, infection, cellulitis, abscess.  73 year old male presents to the ED with complaint of an insect bite to his buttocks 1 week ago.  He states he did not actually see the insect but has an area that has continued to burn with some itching.  It has been unrelieved with Epsom salt baths and peroxide.  Small nonfluctuant cellulitic area with possible abscess forming.  Patient was made aware.  He will continue with warm moist compresses and begin taking antibiotics.  He is aware that if this area continues to get bigger or not improving he is to return to the emergency department.  A prescription for cephalexin  500 mg  4 times daily for 7 days was sent to the pharmacy.  Return precautions given.      Patient's presentation is most consistent with acute illness / injury with system symptoms.  FINAL CLINICAL IMPRESSION(S) / ED DIAGNOSES   Final diagnoses:  Cellulitis and abscess of buttock     Rx / DC Orders   ED Discharge Orders          Ordered    cephALEXin  (KEFLEX ) 500 MG capsule  4 times daily        10/28/23 1226    HYDROcodone -acetaminophen  (NORCO/VICODIN) 5-325 MG tablet  Every 6 hours PRN        10/28/23 1226             Note:  This document was prepared using Dragon voice recognition software and may include unintentional dictation errors.   Saunders Shona CROME, PA-C 10/28/23 1549    Suzanne Kirsch, MD 10/28/23 [phone]

## 2023-10-28 NOTE — ED Triage Notes (Signed)
 Pt to ED for insect bite to buttock 1 week ago. Has been soaking in epsom salt baths but area is still burning. Had some itching at first and put H2O2 on it. Also c./o intermittent HA. No pain at this time.

## 2023-10-28 NOTE — Discharge Instructions (Signed)
 Follow-up with your primary care provider or return to the emergency department over the holiday weekend if any worsening of your symptoms or if the area becomes soft and mushy at which time we can open and drain the area.  Continue using warm moist compresses.  A prescription for antibiotics was sent to the pharmacy along with something for pain.  Be aware that the pain medication could cause drowsiness and increase your risk for falling.  Also do not drive while taking the pain medication.  This area may began draining on its own and if it does then you will not need to return to the emergency department.

## 2023-10-28 NOTE — ED Notes (Signed)
 See triage note  Presents with possible insect bite to right lower back area Afebrile on arrival

## 2023-11-21 ENCOUNTER — Other Ambulatory Visit: Payer: Self-pay | Admitting: Cardiology

## 2024-02-18 ENCOUNTER — Other Ambulatory Visit: Payer: Self-pay | Admitting: Cardiology

## 2024-05-17 ENCOUNTER — Other Ambulatory Visit: Payer: Self-pay

## 2024-05-17 ENCOUNTER — Other Ambulatory Visit: Payer: Self-pay | Admitting: Cardiovascular Disease

## 2024-05-17 DIAGNOSIS — I509 Heart failure, unspecified: Secondary | ICD-10-CM

## 2024-05-17 MED ORDER — TAMSULOSIN HCL 0.4 MG PO CAPS: 0.4000 mg | ORAL_CAPSULE | Freq: Every day | ORAL | 0 refills | Status: AC
